# Patient Record
Sex: Female | Born: 1990 | Race: White | Hispanic: No | Marital: Single | Smoking: Former smoker
Health system: Southern US, Community
[De-identification: ages and names within clinical notes are randomized; demographics above are authoritative.]

## PROBLEM LIST (undated history)

## (undated) ENCOUNTER — Inpatient Hospital Stay: Payer: Self-pay

## (undated) DIAGNOSIS — F419 Anxiety disorder, unspecified: Secondary | ICD-10-CM

## (undated) DIAGNOSIS — F32A Depression, unspecified: Secondary | ICD-10-CM

## (undated) DIAGNOSIS — F329 Major depressive disorder, single episode, unspecified: Secondary | ICD-10-CM

## (undated) DIAGNOSIS — D649 Anemia, unspecified: Secondary | ICD-10-CM

## (undated) HISTORY — DX: Anxiety disorder, unspecified: F41.9

## (undated) HISTORY — DX: Major depressive disorder, single episode, unspecified: F32.9

## (undated) HISTORY — DX: Depression, unspecified: F32.A

---
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
## 2013-03-05 ENCOUNTER — Emergency Department: Payer: Self-pay | Admitting: Emergency Medicine

## 2013-03-05 LAB — COMPREHENSIVE METABOLIC PANEL
Anion Gap: 5 — ABNORMAL LOW (ref 7–16)
Bilirubin,Total: 0.4 mg/dL (ref 0.2–1.0)
Calcium, Total: 9.8 mg/dL (ref 8.5–10.1)
Co2: 27 mmol/L (ref 21–32)
Creatinine: 0.87 mg/dL (ref 0.60–1.30)
EGFR (African American): >60
EGFR (Non-African Amer.): >60
SGPT (ALT): 20 U/L (ref 12–78)
Sodium: 136 mmol/L (ref 136–145)

## 2013-03-05 LAB — CBC
HGB: 14.1 g/dL (ref 12.0–16.0)
MCHC: 34.6 g/dL (ref 32.0–36.0)
MCV: 87 fL (ref 80–100)
Platelet: 212 10*3/uL (ref 150–440)
RBC: 4.66 10*6/uL (ref 3.80–5.20)
RDW: 13 % (ref 11.5–14.5)
WBC: 7.9 10*3/uL (ref 3.6–11.0)

## 2013-03-05 LAB — URINALYSIS, COMPLETE
Bilirubin,UR: NEGATIVE
Blood: NEGATIVE
Nitrite: NEGATIVE
Ph: 6 (ref 4.5–8.0)
Specific Gravity: 1.017 (ref 1.003–1.030)
WBC UR: 4 /HPF (ref 0–5)

## 2013-03-05 LAB — LIPASE, BLOOD: Lipase: 113 U/L (ref 73–393)

## 2013-04-20 LAB — OB RESULTS CONSOLE ANTIBODY SCREEN: ANTIBODY SCREEN: NEGATIVE

## 2013-04-20 LAB — OB RESULTS CONSOLE ABO/RH: RH TYPE: POSITIVE

## 2013-04-20 LAB — OB RESULTS CONSOLE RPR: RPR: NONREACTIVE

## 2013-04-20 LAB — OB RESULTS CONSOLE HEPATITIS B SURFACE ANTIGEN: Hepatitis B Surface Ag: NEGATIVE

## 2013-04-20 LAB — OB RESULTS CONSOLE HIV ANTIBODY (ROUTINE TESTING): HIV: NONREACTIVE

## 2013-04-20 LAB — OB RESULTS CONSOLE RUBELLA ANTIBODY, IGM: Rubella: IMMUNE

## 2013-05-13 HISTORY — PX: OTHER SURGICAL HISTORY: SHX169

## 2013-08-25 LAB — OB RESULTS CONSOLE RPR: RPR: NONREACTIVE

## 2013-10-28 LAB — OB RESULTS CONSOLE GBS
GBS: NEGATIVE
GBS: NEGATIVE

## 2013-11-12 ENCOUNTER — Encounter (HOSPITAL_COMMUNITY): Payer: Self-pay | Admitting: *Deleted

## 2013-11-12 ENCOUNTER — Inpatient Hospital Stay (HOSPITAL_COMMUNITY)
Admission: AD | Admit: 2013-11-12 | Discharge: 2013-11-12 | Disposition: A | Discharge status: Home / Self Care | Payer: Medicaid Other | Source: Ambulatory Visit | Attending: Obstetrics and Gynecology | Admitting: Obstetrics and Gynecology

## 2013-11-12 DIAGNOSIS — O2390 Unspecified genitourinary tract infection in pregnancy, unspecified trimester: Secondary | ICD-10-CM | POA: Insufficient documentation

## 2013-11-12 DIAGNOSIS — N949 Unspecified condition associated with female genital organs and menstrual cycle: Secondary | ICD-10-CM | POA: Insufficient documentation

## 2013-11-12 DIAGNOSIS — R1032 Left lower quadrant pain: Secondary | ICD-10-CM | POA: Insufficient documentation

## 2013-11-12 DIAGNOSIS — N3000 Acute cystitis without hematuria: Secondary | ICD-10-CM | POA: Insufficient documentation

## 2013-11-12 HISTORY — DX: Anemia, unspecified: D64.9

## 2013-11-12 LAB — URINALYSIS, ROUTINE W REFLEX MICROSCOPIC
BILIRUBIN URINE: NEGATIVE
Glucose, UA: NEGATIVE mg/dL
KETONES UR: NEGATIVE mg/dL
NITRITE: NEGATIVE
Protein, ur: NEGATIVE mg/dL
SPECIFIC GRAVITY, URINE: 1.01 (ref 1.005–1.030)
Urobilinogen, UA: 0.2 mg/dL (ref 0.0–1.0)
pH: 6.5 (ref 5.0–8.0)

## 2013-11-12 LAB — URINE MICROSCOPIC-ADD ON

## 2013-11-12 MED ORDER — NITROFURANTOIN MONOHYD MACRO 100 MG PO CAPS: 100.0000 mg | ORAL_CAPSULE | Freq: Two times a day (BID) | ORAL | Status: DC

## 2013-11-12 NOTE — Discharge Instructions (Signed)
Pregnancy and Urinary Tract Infection °A urinary tract infection (UTI) is a bacterial infection of the urinary tract. Infection of the urinary tract can include the ureters, kidneys (pyelonephritis), bladder (cystitis), and urethra (urethritis). All pregnant women should be screened for bacteria in the urinary tract. Identifying and treating a UTI will decrease the risk of preterm labor and developing more serious infections in both the mother and baby. °CAUSES °Bacteria germs cause almost all UTIs.  °RISK FACTORS °Many factors can increase your chances of getting a UTI during pregnancy. These include: °· Having a short urethra. °· Poor toilet and hygiene habits. °· Sexual intercourse. °· Blockage of urine along the urinary tract. °· Problems with the pelvic muscles or nerves. °· Diabetes. °· Obesity. °· Bladder problems after having several children. °· Previous history of UTI. °SIGNS AND SYMPTOMS  °· Pain, burning, or a stinging feeling when urinating. °· Suddenly feeling the need to urinate right away (urgency). °· Loss of bladder control (urinary incontinence). °· Frequent urination, more than is common with pregnancy. °· Lower abdominal or back discomfort. °· Cloudy urine. °· Blood in the urine (hematuria). °· Fever.  °When the kidneys are infected, the symptoms may be: °· Back pain. °· Flank pain on the right side more so than the left. °· Fever. °· Chills. °· Nausea. °· Vomiting. °DIAGNOSIS  °A urinary tract infection is usually diagnosed through urine tests. Additional tests and procedures are sometimes done. These may include: °· Ultrasound exam of the kidneys, ureters, bladder, and urethra. °· Looking in the bladder with a lighted tube (cystoscopy). °TREATMENT °Typically, UTIs can be treated with antibiotic medicines.  °HOME CARE INSTRUCTIONS  °· Only take over-the-counter or prescription medicines as directed by your health care provider. If you were prescribed antibiotics, take them as directed. Finish  them even if you start to feel better. °· Drink enough fluids to keep your urine clear or pale yellow. °· Do not have sexual intercourse until the infection is gone and your health care provider says it is okay. °· Make sure you are tested for UTIs throughout your pregnancy. These infections often come back.  °Preventing a UTI in the Future °· Practice good toilet habits. Always wipe from front to back. Use the tissue only once. °· Do not hold your urine. Empty your bladder as soon as possible when the urge comes. °· Do not douche or use deodorant sprays. °· Wash with soap and warm water around the genital area and the anus. °· Empty your bladder before and after sexual intercourse. °· Wear underwear with a cotton crotch. °· Avoid caffeine and carbonated drinks. They can irritate the bladder. °· Drink cranberry juice or take cranberry pills. This may decrease the risk of getting a UTI. °· Do not drink alcohol. °· Keep all your appointments and tests as scheduled.  °SEEK MEDICAL CARE IF:  °· Your symptoms get worse. °· You are still having fevers 2 or more days after treatment begins. °· You have a rash. °· You feel that you are having problems with medicines prescribed. °· You have abnormal vaginal discharge. °SEEK IMMEDIATE MEDICAL CARE IF:  °· You have back or flank pain. °· You have chills. °· You have blood in your urine. °· You have nausea and vomiting. °· You have contractions of your uterus. °· You have a gush of fluid from the vagina. °MAKE SURE YOU: °· Understand these instructions.   °· Will watch your condition.   °· Will get help right away if you are not doing   well or get worse.   °Document Released: 08/24/2010 Document Revised: 02/17/2013 Document Reviewed: 11/26/2012 °[employer]® Patient Information ©2015 [employer]. This information is not intended to replace advice given to you by your health care provider. Make sure you discuss any questions you have with your health care provider. ° °

## 2013-11-12 NOTE — MAU Note (Signed)
Pains in lower abd and vagina all day which come and go. Some pain in L lateral side on way to hosp. Denies bleeding or leaking fld

## 2013-11-12 NOTE — MAU Provider Note (Signed)
Chief Complaint:  Vaginal Pain and Abdominal Pain   First Provider Initiated Contact with Patient 11/12/13 2033      HPI: Yolanda Pierce is a 23 y.o. G1P0 at 6329w4d who presents to maternity admissions reporting 1 d hx intermittent leftL>right lower abd and groin pain, worse with moving. Rates LLQ pain 3/10 but denies any pain now at rest. No dysuria, frequency or urgency of urination. No fever/chills, nausea/ vomiting. Denies painful contractions, leakage of fluid or vaginal bleeding. No H/A or upper abd pain. Good fetal movement.   Pregnancy Course: essentially uncomplicated  Past Medical History: Past Medical History  Diagnosis Date  . Anemia     Past obstetric history: OB History  Gravida Para Term Preterm AB SAB TAB Ectopic Multiple Living  1             # Outcome Date GA Lbr Len/2nd Weight Sex Delivery Anes PTL Lv  1 CUR               Past Surgical History: Past Surgical History  Procedure Laterality Date  . No past surgeries       Family History: Family History  Problem Relation Age of Onset  . Asthma Mother   . Hypertension Mother   . Diabetes Father   . Hypertension Father     Social History: History  Substance Use Topics  . Smoking status: Never Smoker   . Smokeless tobacco: Never Used  . Alcohol Use: Yes     Comment: only social use before pregnancy    Allergies:  Allergies  Allergen Reactions  . Amoxicillin Rash    Redness and rash all over    Meds:  Prescriptions prior to admission  Medication Sig Dispense Refill  . Iron TABS Take 1 tablet by mouth daily.      . Prenatal Vit-Fe Fumarate-FA (PRENATAL MULTIVITAMIN) TABS tablet Take 1 tablet by mouth daily at 12 noon.        ROS: Pertinent findings in history of present illness.  Physical Exam  Blood pressure 141/75, pulse 89, temperature 98.3 F (36.8 C), resp. rate 20, height 5\' 1"  (1.549 m), weight 196 lb 3.2 oz (88.996 kg). BP recheck 122/82  GENERAL: Well-developed, well-nourished  female in no acute distress.  HEENT: normocephalic HEART: normal rate RESP: normal effort ABDOMEN: Soft, non-tender, gravid appropriate for gestational age EXTREMITIES: Nontender, no edema NEURO: alert and oriented Dilation: 1 Effacement (%): Thick Cervical Position: Posterior Station:  (high) Presentation: Vertex Exam by:: Deidre Lavon Horn CNM  FHT:  Baseline 140 , moderate variability, accelerations present, no decelerations Contractions: occ, mild   Labs: Results for orders placed during the hospital encounter of 11/12/13 (from the past 24 hour(s))  URINALYSIS, ROUTINE W REFLEX MICROSCOPIC     Status: Abnormal   Collection Time    11/12/13  7:52 PM      Result Value Ref Range   Color, Urine YELLOW  YELLOW   APPearance CLEAR  CLEAR   Specific Gravity, Urine 1.010  1.005 - 1.030   pH 6.5  5.0 - 8.0   Glucose, UA NEGATIVE  NEGATIVE mg/dL   Hgb urine dipstick SMALL (*) NEGATIVE   Bilirubin Urine NEGATIVE  NEGATIVE   Ketones, ur NEGATIVE  NEGATIVE mg/dL   Protein, ur NEGATIVE  NEGATIVE mg/dL   Urobilinogen, UA 0.2  0.0 - 1.0 mg/dL   Nitrite NEGATIVE  NEGATIVE   Leukocytes, UA LARGE (*) NEGATIVE  URINE MICROSCOPIC-ADD ON     Status: Abnormal  Collection Time    11/12/13  7:52 PM      Result Value Ref Range   Squamous Epithelial / LPF FEW (*) RARE   WBC, UA 7-10  <3 WBC/hpf   RBC / HPF 3-6  <3 RBC/hpf   Bacteria, UA MANY (*) RARE    Imaging:  No results found. MAU Course: C/W Dr. Tenny Crawoss Urine C&S sent  Assessment:  G1 @[redacted]w[redacted]d  RLP Category 1 FHR, no evidence labor 1. Acute cystitis without hematuria     Plan: Discharge home Labor precautions and fetal kick counts    Medication List    ASK your doctor about these medications       Iron Tabs  Take 1 tablet by mouth daily.     prenatal multivitamin Tabs tablet  Take 1 tablet by mouth daily at 12 noon.        Danae Orleanseirdre C Shyann Hefner, CNM 11/12/2013 8:37 PM

## 2013-11-13 LAB — OB RESULTS CONSOLE GBS: STREP GROUP B AG: NEGATIVE

## 2013-11-14 LAB — CULTURE, OB URINE

## 2013-11-21 ENCOUNTER — Inpatient Hospital Stay (HOSPITAL_COMMUNITY)
Admit: 2013-11-21 | Discharge: 2013-11-26 | DRG: 766 | Disposition: A | Discharge status: Home / Self Care | Payer: Medicaid Other | Source: Ambulatory Visit | Attending: Obstetrics & Gynecology | Admitting: Obstetrics & Gynecology

## 2013-11-21 DIAGNOSIS — O9902 Anemia complicating childbirth: Secondary | ICD-10-CM | POA: Diagnosis present

## 2013-11-21 DIAGNOSIS — O480 Post-term pregnancy: Secondary | ICD-10-CM | POA: Diagnosis present

## 2013-11-21 DIAGNOSIS — D649 Anemia, unspecified: Secondary | ICD-10-CM | POA: Diagnosis present

## 2013-11-21 DIAGNOSIS — Z98891 History of uterine scar from previous surgery: Secondary | ICD-10-CM

## 2013-11-21 DIAGNOSIS — O339 Maternal care for disproportion, unspecified: Secondary | ICD-10-CM | POA: Diagnosis present

## 2013-11-21 DIAGNOSIS — O330 Maternal care for disproportion due to deformity of maternal pelvic bones: Secondary | ICD-10-CM | POA: Diagnosis present

## 2013-11-21 LAB — CBC
HCT: 31.9 % — ABNORMAL LOW (ref 36.0–46.0)
HEMOGLOBIN: 10.1 g/dL — AB (ref 12.0–15.0)
MCH: 26 pg (ref 26.0–34.0)
MCHC: 31.7 g/dL (ref 30.0–36.0)
MCV: 82 fL (ref 78.0–100.0)
Platelets: 169 10*3/uL (ref 150–400)
RBC: 3.89 MIL/uL (ref 3.87–5.11)
RDW: 14.5 % (ref 11.5–15.5)
WBC: 9.9 10*3/uL (ref 4.0–10.5)

## 2013-11-21 LAB — OB RESULTS CONSOLE RPR: RPR: NONREACTIVE

## 2013-11-21 MED ORDER — LACTATED RINGERS IV SOLN: 500.0000 mL | INTRAVENOUS | Status: DC | PRN

## 2013-11-21 MED ORDER — MISOPROSTOL 25 MCG QUARTER TABLET
25.0000 ug | ORAL_TABLET | ORAL | Status: DC | PRN
  Administered 2013-11-21 – 2013-11-22 (×2): 25 ug via VAGINAL
  Filled 2013-11-21 (×2): qty 0.25

## 2013-11-21 MED ORDER — OXYTOCIN BOLUS FROM INFUSION: 500.0000 mL | INTRAVENOUS | Status: DC

## 2013-11-21 MED ORDER — TERBUTALINE SULFATE 1 MG/ML IJ SOLN: 0.2500 mg | Freq: Once | INTRAMUSCULAR | Status: AC | PRN

## 2013-11-21 MED ORDER — IBUPROFEN 600 MG PO TABS: 600.0000 mg | ORAL_TABLET | Freq: Four times a day (QID) | ORAL | Status: DC | PRN

## 2013-11-21 MED ORDER — LACTATED RINGERS IV SOLN
INTRAVENOUS | Status: DC
  Administered 2013-11-21 – 2013-11-24 (×7): via INTRAVENOUS

## 2013-11-21 MED ORDER — CITRIC ACID-SODIUM CITRATE 334-500 MG/5ML PO SOLN
30.0000 mL | ORAL | Status: DC | PRN
  Administered 2013-11-24: 30 mL via ORAL
  Filled 2013-11-21: qty 15

## 2013-11-21 MED ORDER — ONDANSETRON HCL 4 MG/2ML IJ SOLN
4.0000 mg | Freq: Four times a day (QID) | INTRAMUSCULAR | Status: DC | PRN
  Administered 2013-11-22 – 2013-11-24 (×3): 4 mg via INTRAVENOUS
  Filled 2013-11-21 (×3): qty 2

## 2013-11-21 MED ORDER — LIDOCAINE HCL (PF) 1 % IJ SOLN
30.0000 mL | INTRAMUSCULAR | Status: DC | PRN
  Filled 2013-11-21: qty 30

## 2013-11-21 MED ORDER — ACETAMINOPHEN 325 MG PO TABS: 650.0000 mg | ORAL_TABLET | ORAL | Status: DC | PRN

## 2013-11-21 MED ORDER — OXYTOCIN 40 UNITS IN LACTATED RINGERS INFUSION - SIMPLE MED
62.5000 mL/h | INTRAVENOUS | Status: DC
  Filled 2013-11-21: qty 1000

## 2013-11-21 MED ORDER — OXYCODONE-ACETAMINOPHEN 5-325 MG PO TABS: 1.0000 | ORAL_TABLET | ORAL | Status: DC | PRN

## 2013-11-22 LAB — RPR

## 2013-11-22 LAB — TYPE AND SCREEN
ABO/RH(D): A POS
ANTIBODY SCREEN: NEGATIVE

## 2013-11-22 LAB — ABO/RH: ABO/RH(D): A POS

## 2013-11-22 MED ORDER — FENTANYL CITRATE 0.05 MG/ML IJ SOLN
100.0000 ug | INTRAMUSCULAR | Status: DC | PRN
  Administered 2013-11-22 – 2013-11-24 (×7): 100 ug via INTRAVENOUS
  Filled 2013-11-22 (×7): qty 2

## 2013-11-22 MED ORDER — LACTATED RINGERS IV SOLN
500.0000 mL | Freq: Once | INTRAVENOUS | Status: DC
Start: 2013-11-22 — End: 2013-11-24

## 2013-11-22 MED ORDER — EPHEDRINE 5 MG/ML INJ: 10.0000 mg | INTRAVENOUS | Status: DC | PRN

## 2013-11-22 MED ORDER — PHENYLEPHRINE 40 MCG/ML (10ML) SYRINGE FOR IV PUSH (FOR BLOOD PRESSURE SUPPORT): 80.0000 ug | PREFILLED_SYRINGE | INTRAVENOUS | Status: DC | PRN

## 2013-11-22 MED ORDER — PHENYLEPHRINE 40 MCG/ML (10ML) SYRINGE FOR IV PUSH (FOR BLOOD PRESSURE SUPPORT)
80.0000 ug | PREFILLED_SYRINGE | INTRAVENOUS | Status: DC | PRN
  Filled 2013-11-22: qty 10

## 2013-11-22 MED ORDER — FENTANYL 2.5 MCG/ML BUPIVACAINE 1/10 % EPIDURAL INFUSION (WH - ANES)
14.0000 mL/h | INTRAMUSCULAR | Status: DC | PRN
  Administered 2013-11-23 – 2013-11-24 (×7): 14 mL/h via EPIDURAL
  Filled 2013-11-22 (×7): qty 125

## 2013-11-22 MED ORDER — DIPHENHYDRAMINE HCL 50 MG/ML IJ SOLN: 12.5000 mg | INTRAMUSCULAR | Status: DC | PRN

## 2013-11-22 MED ORDER — OXYTOCIN 40 UNITS IN LACTATED RINGERS INFUSION - SIMPLE MED
1.0000 m[IU]/min | INTRAVENOUS | Status: DC
  Administered 2013-11-22: 2 m[IU]/min via INTRAVENOUS
  Administered 2013-11-23: 20 m[IU]/min via INTRAVENOUS
  Filled 2013-11-22: qty 1000

## 2013-11-22 NOTE — H&P (Signed)
23 y.o. 654w0d  G1P0 comes in for IOL for pdd.  Otherwise has good fetal movement and no bleeding.  Past Medical History  Diagnosis Date  . Anemia     Past Surgical History  Procedure Laterality Date  . No past surgeries      OB History  Gravida Para Term Preterm AB SAB TAB Ectopic Multiple Living  1             # Outcome Date GA Lbr Len/2nd Weight Sex Delivery Anes PTL Lv  1 CUR               History   Social History  . Marital Status: Single    Spouse Name: N/A    Number of Children: N/A  . Years of Education: N/A   Occupational History  . Not on file.   Social History Main Topics  . Smoking status: Never Smoker   . Smokeless tobacco: Never Used  . Alcohol Use: Yes     Comment: only social use before pregnancy  . Drug Use: No  . Sexual Activity: Not on file   Other Topics Concern  . Not on file   Social History Narrative  . No narrative on file   Amoxicillin    Prenatal Transfer Tool  Maternal Diabetes: No Genetic Screening: Normal Maternal Ultrasounds/Referrals: Normal Fetal Ultrasounds or other Referrals:  None Maternal Substance Abuse:  No Significant Maternal Medications:  None Significant Maternal Lab Results: Lab values include: Group B Strep negative  Other PNC: uncomplicated.    Filed Vitals:   11/22/13 0901  BP: 123/70  Pulse: 70  Temp:   Resp: 20     Lungs/Cor:  NAD Abdomen:  soft, gravid Ex:  no cords, erythema SVE:  1/50/-2 FHTs:  135, good STV, NST R Toco:  q 1.5-3   A/P  Admit for IOL  GBS Neg  Received one dose of cytotec last night, ctx too much to receive another, switched to pitocin.  May consider d/c of pit and back to cytotec if no change.  Other routine care  Epidural when desired.  Philip AspenALLAHAN, Larsen Dungan

## 2013-11-22 NOTE — Progress Notes (Addendum)
Patient called out c/o constant back pain, gave patient IV pain medication and also called Dr Claiborne Billingsallahan to notify of inability to place another cytotec; orders were obtained to give patient another hour to see if contractions space out enough to place another cytotec and also to give patient a K pad for her back; Kpad  brought to patient's room and allowed to warm up and applied on patient's back; patient c/o only back pain at this time; instructed patient to give kpad a few minutes to see if it will help and if not we can try something else for pain; patient verbalized understanding.

## 2013-11-23 ENCOUNTER — Encounter (HOSPITAL_COMMUNITY): Payer: Self-pay | Admitting: *Deleted

## 2013-11-23 ENCOUNTER — Encounter (HOSPITAL_COMMUNITY): Payer: Medicaid Other | Admitting: Anesthesiology

## 2013-11-23 ENCOUNTER — Inpatient Hospital Stay (HOSPITAL_COMMUNITY): Payer: Medicaid Other | Admitting: Anesthesiology

## 2013-11-23 MED ORDER — LIDOCAINE HCL (PF) 1 % IJ SOLN
INTRAMUSCULAR | Status: DC | PRN
Start: 2013-11-23 — End: 2013-11-24
  Administered 2013-11-23 (×4): 4 mL

## 2013-11-23 NOTE — Anesthesia Preprocedure Evaluation (Addendum)
Anesthesia Evaluation  Patient identified by MRN, date of birth, ID band Patient awake    Reviewed: Allergy & Precautions, H&P , NPO status , Patient's Chart, lab work & pertinent test results, reviewed documented beta blocker date and time   History of Anesthesia Complications Negative for: history of anesthetic complications  Airway Mallampati: III TM Distance: >3 FB Neck ROM: full    Dental  (+) Teeth Intact   Pulmonary neg pulmonary ROS,  breath sounds clear to auscultation        Cardiovascular negative cardio ROS  Rhythm:regular Rate:Normal     Neuro/Psych negative neurological ROS  negative psych ROS   GI/Hepatic negative GI ROS, Neg liver ROS,   Endo/Other  BMI 36.5  Renal/GU negative Renal ROS     Musculoskeletal   Abdominal   Peds  Hematology  (+) anemia ,   Anesthesia Other Findings   Reproductive/Obstetrics (+) Pregnancy                           Anesthesia Physical Anesthesia Plan  ASA: II and emergent  Anesthesia Plan: Epidural   Post-op Pain Management:    Induction:   Airway Management Planned: Natural Airway  Additional Equipment:   Intra-op Plan:   Post-operative Plan:   Informed Consent: I have reviewed the patients History and Physical, chart, labs and discussed the procedure including the risks, benefits and alternatives for the proposed anesthesia with the patient or authorized representative who has indicated his/her understanding and acceptance.     Plan Discussed with: CRNA, Anesthesiologist and Surgeon  Anesthesia Plan Comments: (Patient for C/Section for arrest of descent. Will use existing epidural catheter for C/Section.)       Anesthesia Quick Evaluation

## 2013-11-23 NOTE — Progress Notes (Signed)
SCDs applied per MD order after epidural; yellow socks refused at this time

## 2013-11-23 NOTE — Progress Notes (Signed)
Pt doing well.    120s, g stv, NST R, one mild variable Toco q2-3  SVE 3/90/-2; AROM clear  Continue induction- pit.

## 2013-11-23 NOTE — Progress Notes (Signed)
Pt received cytotec x 1 dose Sunday night, was contracting too frequently for second dose so switched to pitocin overnight.  Monday morning no cervical change from 1cm, pitocin d/c's and cytotec restarted.  She received cytotec during the day and again was contracting too frequently for 7pm dose.  Attempt was made to place a foley bulb, pt had received IV pain medication which was not working well for her.  Two attempts to insert the foley bulb were made without success and had to stop due to pt discomfort.  Will again try pitocin 2x2 overnight. FHT Cat 1.

## 2013-11-23 NOTE — Anesthesia Procedure Notes (Signed)
Epidural Patient location during procedure: OB Start time: 11/23/2013 12:16 AM  Staffing Performed by: anesthesiologist   Preanesthetic Checklist Completed: patient identified, site marked, surgical consent, pre-op evaluation, timeout performed, IV checked, risks and benefits discussed and monitors and equipment checked  Epidural Patient position: sitting Prep: site prepped and draped and DuraPrep Patient monitoring: continuous pulse ox and blood pressure Approach: midline Location: L3-L4 Injection technique: LOR air  Needle:  Needle type: Tuohy  Needle gauge: 17 G Needle length: 9 cm and 9 Needle insertion depth: 6 cm Catheter type: closed end flexible Catheter size: 19 Gauge Catheter at skin depth: 11 cm Test dose: negative  Assessment Events: blood not aspirated, injection not painful, no injection resistance, negative IV test and no paresthesia  Additional Notes Discussed risk of headache, infection, bleeding, nerve injury and failed or incomplete block.  Patient voices understanding and wishes to proceed.  Epidural placed easily on first attempt. No paresthesia.  Patient tolerated procedure well with no apparent complications.  Jasmine DecemberA> Krystel Fletchall, MDReason for block:procedure for pain

## 2013-11-24 ENCOUNTER — Encounter (HOSPITAL_COMMUNITY): Payer: Self-pay | Admitting: *Deleted

## 2013-11-24 ENCOUNTER — Encounter (HOSPITAL_COMMUNITY)
Admission: AD | Disposition: A | Discharge status: Home / Self Care | Payer: Self-pay | Source: Ambulatory Visit | Attending: Obstetrics and Gynecology

## 2013-11-24 ENCOUNTER — Inpatient Hospital Stay (HOSPITAL_COMMUNITY): Admission: AD | Admit: 2013-11-24 | Payer: Self-pay | Source: Ambulatory Visit | Admitting: Obstetrics and Gynecology

## 2013-11-24 LAB — COMPREHENSIVE METABOLIC PANEL
ALBUMIN: 2.4 g/dL — AB (ref 3.5–5.2)
ALT: 9 U/L (ref 0–35)
AST: 21 U/L (ref 0–37)
Alkaline Phosphatase: 163 U/L — ABNORMAL HIGH (ref 39–117)
Anion gap: 18 — ABNORMAL HIGH (ref 5–15)
BILIRUBIN TOTAL: 0.8 mg/dL (ref 0.3–1.2)
BUN: 11 mg/dL (ref 6–23)
CALCIUM: 8.5 mg/dL (ref 8.4–10.5)
CHLORIDE: 99 meq/L (ref 96–112)
CO2: 19 mEq/L (ref 19–32)
CREATININE: 1.21 mg/dL — AB (ref 0.50–1.10)
GFR calc Af Amer: 74 mL/min — ABNORMAL LOW (ref >90)
GFR calc non Af Amer: 63 mL/min — ABNORMAL LOW (ref >90)
Glucose, Bld: 99 mg/dL (ref 70–99)
Potassium: 3.4 mEq/L — ABNORMAL LOW (ref 3.7–5.3)
Sodium: 136 mEq/L — ABNORMAL LOW (ref 137–147)
Total Protein: 5.4 g/dL — ABNORMAL LOW (ref 6.0–8.3)

## 2013-11-24 LAB — CBC
HEMATOCRIT: 30.4 % — AB (ref 36.0–46.0)
Hemoglobin: 9.9 g/dL — ABNORMAL LOW (ref 12.0–15.0)
MCH: 26.5 pg (ref 26.0–34.0)
MCHC: 32.6 g/dL (ref 30.0–36.0)
MCV: 81.5 fL (ref 78.0–100.0)
Platelets: 156 10*3/uL (ref 150–400)
RBC: 3.73 MIL/uL — ABNORMAL LOW (ref 3.87–5.11)
RDW: 14.7 % (ref 11.5–15.5)
WBC: 21 10*3/uL — ABNORMAL HIGH (ref 4.0–10.5)

## 2013-11-24 LAB — SAMPLE TO BLOOD BANK

## 2013-11-24 LAB — LACTATE DEHYDROGENASE: LDH: 273 U/L — ABNORMAL HIGH (ref 94–250)

## 2013-11-24 LAB — URIC ACID: Uric Acid, Serum: 8.5 mg/dL — ABNORMAL HIGH (ref 2.4–7.0)

## 2013-11-24 SURGERY — Surgical Case
Anesthesia: Epidural | Site: Abdomen

## 2013-11-24 MED ORDER — MORPHINE SULFATE (PF) 0.5 MG/ML IJ SOLN
INTRAMUSCULAR | Status: DC | PRN
  Administered 2013-11-24: 1 mg via INTRAVENOUS
  Administered 2013-11-24: 4 mg via EPIDURAL

## 2013-11-24 MED ORDER — OXYTOCIN 10 UNIT/ML IJ SOLN
INTRAMUSCULAR | Status: AC
  Filled 2013-11-24: qty 4

## 2013-11-24 MED ORDER — LACTATED RINGERS IV SOLN
INTRAVENOUS | Status: DC
  Administered 2013-11-24 – 2013-11-25 (×2): via INTRAVENOUS

## 2013-11-24 MED ORDER — METOCLOPRAMIDE HCL 5 MG/ML IJ SOLN
INTRAMUSCULAR | Status: DC | PRN
  Administered 2013-11-24: 10 mg via INTRAVENOUS

## 2013-11-24 MED ORDER — PRENATAL MULTIVITAMIN CH
1.0000 | ORAL_TABLET | Freq: Every day | ORAL | Status: DC
  Administered 2013-11-25 – 2013-11-26 (×2): 1 via ORAL
  Filled 2013-11-24 (×4): qty 1

## 2013-11-24 MED ORDER — ONDANSETRON HCL 4 MG/2ML IJ SOLN: 4.0000 mg | INTRAMUSCULAR | Status: DC | PRN

## 2013-11-24 MED ORDER — METOCLOPRAMIDE HCL 5 MG/ML IJ SOLN: 10.0000 mg | Freq: Three times a day (TID) | INTRAMUSCULAR | Status: DC | PRN

## 2013-11-24 MED ORDER — PHENYLEPHRINE 40 MCG/ML (10ML) SYRINGE FOR IV PUSH (FOR BLOOD PRESSURE SUPPORT)
PREFILLED_SYRINGE | INTRAVENOUS | Status: AC
  Filled 2013-11-24: qty 5

## 2013-11-24 MED ORDER — KETOROLAC TROMETHAMINE 30 MG/ML IJ SOLN
30.0000 mg | Freq: Four times a day (QID) | INTRAMUSCULAR | Status: AC | PRN
  Administered 2013-11-24: 30 mg via INTRAVENOUS

## 2013-11-24 MED ORDER — DIPHENHYDRAMINE HCL 50 MG/ML IJ SOLN: 12.5000 mg | INTRAMUSCULAR | Status: DC | PRN

## 2013-11-24 MED ORDER — SCOPOLAMINE 1 MG/3DAYS TD PT72
MEDICATED_PATCH | TRANSDERMAL | Status: AC
  Filled 2013-11-24: qty 1

## 2013-11-24 MED ORDER — LACTATED RINGERS IV SOLN
INTRAVENOUS | Status: DC | PRN
  Administered 2013-11-24 (×3): via INTRAVENOUS

## 2013-11-24 MED ORDER — KETOROLAC TROMETHAMINE 30 MG/ML IJ SOLN
INTRAMUSCULAR | Status: AC
  Filled 2013-11-24: qty 1

## 2013-11-24 MED ORDER — DIBUCAINE 1 % RE OINT
1.0000 "application " | TOPICAL_OINTMENT | RECTAL | Status: DC | PRN
  Filled 2013-11-24: qty 28

## 2013-11-24 MED ORDER — SENNOSIDES-DOCUSATE SODIUM 8.6-50 MG PO TABS
2.0000 | ORAL_TABLET | ORAL | Status: DC
  Administered 2013-11-24 – 2013-11-25 (×2): 2 via ORAL
  Filled 2013-11-24 (×4): qty 2

## 2013-11-24 MED ORDER — SIMETHICONE 80 MG PO CHEW
80.0000 mg | CHEWABLE_TABLET | ORAL | Status: DC | PRN
  Filled 2013-11-24: qty 1

## 2013-11-24 MED ORDER — SODIUM BICARBONATE 8.4 % IV SOLN
INTRAVENOUS | Status: AC
  Filled 2013-11-24: qty 50

## 2013-11-24 MED ORDER — FENTANYL CITRATE 0.05 MG/ML IJ SOLN
INTRAMUSCULAR | Status: DC | PRN
  Administered 2013-11-24: 100 ug via INTRAVENOUS

## 2013-11-24 MED ORDER — NALOXONE HCL 0.4 MG/ML IJ SOLN: 0.4000 mg | INTRAMUSCULAR | Status: DC | PRN

## 2013-11-24 MED ORDER — DIPHENHYDRAMINE HCL 25 MG PO CAPS
25.0000 mg | ORAL_CAPSULE | ORAL | Status: DC | PRN
  Filled 2013-11-24: qty 1

## 2013-11-24 MED ORDER — PHENYLEPHRINE HCL 10 MG/ML IJ SOLN
INTRAMUSCULAR | Status: DC | PRN
  Administered 2013-11-24 (×2): 40 ug via INTRAVENOUS

## 2013-11-24 MED ORDER — METHYLERGONOVINE MALEATE 0.2 MG/ML IJ SOLN
INTRAMUSCULAR | Status: DC | PRN
  Administered 2013-11-24: 0.2 mg via INTRAMUSCULAR

## 2013-11-24 MED ORDER — CHLOROPROCAINE HCL 3 % IJ SOLN
INTRAMUSCULAR | Status: AC
  Filled 2013-11-24: qty 20

## 2013-11-24 MED ORDER — KETOROLAC TROMETHAMINE 30 MG/ML IJ SOLN: 30.0000 mg | Freq: Four times a day (QID) | INTRAMUSCULAR | Status: AC | PRN

## 2013-11-24 MED ORDER — NALOXONE HCL 1 MG/ML IJ SOLN: 1.0000 ug/kg/h | INTRAVENOUS | Status: DC | PRN

## 2013-11-24 MED ORDER — IBUPROFEN 600 MG PO TABS
600.0000 mg | ORAL_TABLET | Freq: Four times a day (QID) | ORAL | Status: DC
  Administered 2013-11-24 – 2013-11-26 (×9): 600 mg via ORAL
  Filled 2013-11-24 (×10): qty 1

## 2013-11-24 MED ORDER — NALBUPHINE HCL 10 MG/ML IJ SOLN: 5.0000 mg | INTRAMUSCULAR | Status: DC | PRN

## 2013-11-24 MED ORDER — CLINDAMYCIN PHOSPHATE 900 MG/50ML IV SOLN: 900.0000 mg | INTRAVENOUS | Status: DC

## 2013-11-24 MED ORDER — FENTANYL CITRATE 0.05 MG/ML IJ SOLN: 25.0000 ug | INTRAMUSCULAR | Status: DC | PRN

## 2013-11-24 MED ORDER — DIPHENHYDRAMINE HCL 25 MG PO CAPS
25.0000 mg | ORAL_CAPSULE | Freq: Four times a day (QID) | ORAL | Status: DC | PRN
  Filled 2013-11-24: qty 1

## 2013-11-24 MED ORDER — SODIUM BICARBONATE 8.4 % IV SOLN
INTRAVENOUS | Status: DC | PRN
  Administered 2013-11-24: 7 mL via EPIDURAL
  Administered 2013-11-24: 6 mL via EPIDURAL
  Administered 2013-11-24: 3 mL via EPIDURAL
  Administered 2013-11-24: 4 mL via EPIDURAL

## 2013-11-24 MED ORDER — WITCH HAZEL-GLYCERIN EX PADS: 1.0000 "application " | MEDICATED_PAD | CUTANEOUS | Status: DC | PRN

## 2013-11-24 MED ORDER — LACTATED RINGERS IV SOLN
INTRAVENOUS | Status: DC | PRN
  Administered 2013-11-24: 10:00:00 via INTRAVENOUS

## 2013-11-24 MED ORDER — LIDOCAINE-EPINEPHRINE (PF) 2 %-1:200000 IJ SOLN
INTRAMUSCULAR | Status: AC
  Filled 2013-11-24: qty 20

## 2013-11-24 MED ORDER — OXYTOCIN 40 UNITS IN LACTATED RINGERS INFUSION - SIMPLE MED: 62.5000 mL/h | INTRAVENOUS | Status: AC

## 2013-11-24 MED ORDER — ONDANSETRON HCL 4 MG/2ML IJ SOLN
4.0000 mg | Freq: Three times a day (TID) | INTRAMUSCULAR | Status: DC | PRN
Start: 2013-11-24 — End: 2013-11-26

## 2013-11-24 MED ORDER — SODIUM CHLORIDE 0.9 % IJ SOLN: 3.0000 mL | INTRAMUSCULAR | Status: DC | PRN

## 2013-11-24 MED ORDER — SCOPOLAMINE 1 MG/3DAYS TD PT72
1.0000 | MEDICATED_PATCH | Freq: Once | TRANSDERMAL | Status: DC
  Administered 2013-11-24: 1.5 mg via TRANSDERMAL

## 2013-11-24 MED ORDER — ONDANSETRON HCL 4 MG/2ML IJ SOLN
INTRAMUSCULAR | Status: DC | PRN
  Administered 2013-11-24: 4 mg via INTRAVENOUS

## 2013-11-24 MED ORDER — ZOLPIDEM TARTRATE 5 MG PO TABS: 5.0000 mg | ORAL_TABLET | Freq: Every evening | ORAL | Status: DC | PRN

## 2013-11-24 MED ORDER — ONDANSETRON HCL 4 MG PO TABS: 4.0000 mg | ORAL_TABLET | ORAL | Status: DC | PRN

## 2013-11-24 MED ORDER — DIPHENHYDRAMINE HCL 50 MG/ML IJ SOLN: 25.0000 mg | INTRAMUSCULAR | Status: DC | PRN

## 2013-11-24 MED ORDER — LACTATED RINGERS IV BOLUS (SEPSIS)
1000.0000 mL | Freq: Once | INTRAVENOUS | Status: AC
  Administered 2013-11-24: 1000 mL via INTRAVENOUS

## 2013-11-24 MED ORDER — OXYCODONE-ACETAMINOPHEN 5-325 MG PO TABS
1.0000 | ORAL_TABLET | ORAL | Status: DC | PRN
  Administered 2013-11-25: 1 via ORAL
  Filled 2013-11-24: qty 1

## 2013-11-24 MED ORDER — PHENYLEPHRINE 8 MG IN D5W 100 ML (0.08MG/ML) PREMIX OPTIME
INJECTION | INTRAVENOUS | Status: AC
  Filled 2013-11-24: qty 100

## 2013-11-24 MED ORDER — SIMETHICONE 80 MG PO CHEW
80.0000 mg | CHEWABLE_TABLET | ORAL | Status: DC
  Administered 2013-11-24 – 2013-11-25 (×2): 80 mg via ORAL
  Filled 2013-11-24 (×4): qty 1

## 2013-11-24 MED ORDER — LIDOCAINE HCL (CARDIAC) 20 MG/ML IV SOLN
INTRAVENOUS | Status: AC
  Filled 2013-11-24: qty 5

## 2013-11-24 MED ORDER — MORPHINE SULFATE 0.5 MG/ML IJ SOLN
INTRAMUSCULAR | Status: AC
  Filled 2013-11-24: qty 10

## 2013-11-24 MED ORDER — OXYTOCIN 10 UNIT/ML IJ SOLN
40.0000 [IU] | INTRAVENOUS | Status: DC | PRN
  Administered 2013-11-24: 40 [IU] via INTRAVENOUS

## 2013-11-24 MED ORDER — LANOLIN HYDROUS EX OINT: 1.0000 "application " | TOPICAL_OINTMENT | CUTANEOUS | Status: DC | PRN

## 2013-11-24 MED ORDER — MEPERIDINE HCL 25 MG/ML IJ SOLN: 6.2500 mg | INTRAMUSCULAR | Status: DC | PRN

## 2013-11-24 MED ORDER — FENTANYL CITRATE 0.05 MG/ML IJ SOLN
INTRAMUSCULAR | Status: AC
  Filled 2013-11-24: qty 2

## 2013-11-24 MED ORDER — ONDANSETRON HCL 4 MG/2ML IJ SOLN
INTRAMUSCULAR | Status: AC
  Filled 2013-11-24: qty 2

## 2013-11-24 MED ORDER — TETANUS-DIPHTH-ACELL PERTUSSIS 5-2.5-18.5 LF-MCG/0.5 IM SUSP
0.5000 mL | Freq: Once | INTRAMUSCULAR | Status: DC
  Filled 2013-11-24: qty 0.5

## 2013-11-24 MED ORDER — SODIUM BICARBONATE 8.4 % IV SOLN
INTRAVENOUS | Status: AC
  Filled 2013-11-24: qty 100

## 2013-11-24 MED ORDER — SIMETHICONE 80 MG PO CHEW
80.0000 mg | CHEWABLE_TABLET | Freq: Three times a day (TID) | ORAL | Status: DC
  Administered 2013-11-24 – 2013-11-26 (×6): 80 mg via ORAL
  Filled 2013-11-24 (×11): qty 1

## 2013-11-24 MED ORDER — GENTAMICIN SULFATE 40 MG/ML IJ SOLN: 5.0000 mg/kg | INTRAVENOUS | Status: DC

## 2013-11-24 MED ORDER — MENTHOL 3 MG MT LOZG
1.0000 | LOZENGE | OROMUCOSAL | Status: DC | PRN
  Filled 2013-11-24: qty 9

## 2013-11-24 MED ORDER — PHENYLEPHRINE 8 MG IN D5W 100 ML (0.08MG/ML) PREMIX OPTIME
INJECTION | INTRAVENOUS | Status: DC | PRN
  Administered 2013-11-24: 60 ug/min via INTRAVENOUS

## 2013-11-24 MED ORDER — METHYLERGONOVINE MALEATE 0.2 MG/ML IJ SOLN
INTRAMUSCULAR | Status: AC
  Filled 2013-11-24: qty 1

## 2013-11-24 MED ORDER — GENTAMICIN SULFATE 40 MG/ML IJ SOLN
INTRAVENOUS | Status: AC
  Administered 2013-11-24: 114 mL via INTRAVENOUS
  Filled 2013-11-24: qty 8

## 2013-11-24 SURGICAL SUPPLY — 42 items
BARRIER ADHS 3X4 INTERCEED (GAUZE/BANDAGES/DRESSINGS) ×3 IMPLANT
BENZOIN TINCTURE PRP APPL 2/3 (GAUZE/BANDAGES/DRESSINGS) ×3 IMPLANT
CLAMP CORD UMBIL (MISCELLANEOUS) IMPLANT
CLOSURE WOUND 1/2 X4 (GAUZE/BANDAGES/DRESSINGS) ×1
CLOTH BEACON ORANGE TIMEOUT ST (SAFETY) ×3 IMPLANT
DRAPE LG THREE QUARTER DISP (DRAPES) IMPLANT
DRSG OPSITE POSTOP 4X10 (GAUZE/BANDAGES/DRESSINGS) ×3 IMPLANT
DURAPREP 26ML APPLICATOR (WOUND CARE) ×3 IMPLANT
ELECT REM PT RETURN 9FT ADLT (ELECTROSURGICAL) ×3
ELECTRODE REM PT RTRN 9FT ADLT (ELECTROSURGICAL) ×1 IMPLANT
EXTRACTOR VACUUM BELL STYLE (SUCTIONS) IMPLANT
GLOVE BIO SURGEON STRL SZ 6.5 (GLOVE) ×2 IMPLANT
GLOVE BIO SURGEONS STRL SZ 6.5 (GLOVE) ×1
GLOVE BIOGEL PI IND STRL 7.0 (GLOVE) ×1 IMPLANT
GLOVE BIOGEL PI INDICATOR 7.0 (GLOVE) ×2
GOWN STRL REUS W/TWL LRG LVL3 (GOWN DISPOSABLE) ×9 IMPLANT
KIT ABG SYR 3ML LUER SLIP (SYRINGE) IMPLANT
NEEDLE HYPO 25X5/8 SAFETYGLIDE (NEEDLE) IMPLANT
NS IRRIG 1000ML POUR BTL (IV SOLUTION) ×3 IMPLANT
PACK C SECTION WH (CUSTOM PROCEDURE TRAY) ×3 IMPLANT
PAD ABD 7.5X8 STRL (GAUZE/BANDAGES/DRESSINGS) ×3 IMPLANT
PAD ABD 8X7 1/2 STERILE (GAUZE/BANDAGES/DRESSINGS) ×3 IMPLANT
PAD OB MATERNITY 4.3X12.25 (PERSONAL CARE ITEMS) ×3 IMPLANT
RETRACTOR WND ALEXIS 25 LRG (MISCELLANEOUS) ×1 IMPLANT
RTRCTR WOUND ALEXIS 25CM LRG (MISCELLANEOUS) ×3
STAPLER VISISTAT 35W (STAPLE) IMPLANT
STRIP CLOSURE SKIN 1/2X4 (GAUZE/BANDAGES/DRESSINGS) ×2 IMPLANT
SUT CHROMIC 2 0 CT 1 (SUTURE) ×6 IMPLANT
SUT CHROMIC 3 0 SH 27 (SUTURE) ×3 IMPLANT
SUT MNCRL 0 VIOLET CTX 36 (SUTURE) ×3 IMPLANT
SUT MONOCRYL 0 CTX 36 (SUTURE) ×6
SUT PDS AB 0 CTX 36 PDP370T (SUTURE) IMPLANT
SUT PLAIN 2 0 (SUTURE) ×2
SUT PLAIN ABS 2-0 CT1 27XMFL (SUTURE) ×1 IMPLANT
SUT VIC AB 0 CT1 27 (SUTURE) ×4
SUT VIC AB 0 CT1 27XBRD ANBCTR (SUTURE) ×2 IMPLANT
SUT VIC AB 3-0 SH 27 (SUTURE) ×2
SUT VIC AB 3-0 SH 27X BRD (SUTURE) ×1 IMPLANT
SUT VIC AB 4-0 KS 27 (SUTURE) ×3 IMPLANT
TOWEL OR 17X24 6PK STRL BLUE (TOWEL DISPOSABLE) ×3 IMPLANT
TRAY FOLEY CATH 14FR (SET/KITS/TRAYS/PACK) ×3 IMPLANT
WATER STERILE IRR 1000ML POUR (IV SOLUTION) IMPLANT

## 2013-11-24 NOTE — Progress Notes (Signed)
Dr. Mora ApplPinn called and notified of Pts decreased urine output since bolus at 1700. Bladder scanned 160cc. Less than 30cc/hr output. Orders to wait until morning labs.

## 2013-11-24 NOTE — Progress Notes (Signed)
Yolanda EvesMichele M Pierce is a 23 y.o. G1P0000 at 1563w2d by LMP admitted for induction of labor due to Post dates.  Subjective: Patient feeling more uncomfortable despite epidural  Objective: BP 154/100  Pulse 95  Temp(Src) 98.5 F (36.9 C) (Oral)  Resp 18  Ht 5\' 1"  (1.549 m)  Wt 87.544 kg (193 lb)  BMI 36.49 kg/m2  SpO2 99% I/O last 3 completed shifts: In: -  Out: 1900 [Urine:1900]    FHT:  FHR: 140 bpm, variability: moderate,  accelerations:  Present,  decelerations:  Absent UC:   regular, every 2-3 minutes SVE:   Dilation: 6 Effacement (%): 80 Station: -1 Exam by:: Lucas MallowKlashley, RN  Labs: Lab Results  Component Value Date   WBC 9.9 11/21/2013   HGB 10.1* 11/21/2013   HCT 31.9* 11/21/2013   MCV 82.0 11/21/2013   PLT 169 11/21/2013    Assessment / Plan: Arrest in active phase of labor  Labor: Patient has been 6-7cm for more than 12 hours despite >30 units pitocin Preeclampsia:  no signs or symptoms of toxicity Fetal Wellbeing:  Category I Pain Control:  Epidural I/D:  n/a Anticipated MOD:  Discussed with patient that there is likely CPD and I would recommend a primary Cesarean delivery.   Yolanda Pierce, Yolanda Pierce 11/24/2013, 7:37 AM

## 2013-11-24 NOTE — Anesthesia Postprocedure Evaluation (Signed)
  Anesthesia Post-op Note  Patient: Yolanda Pierce  Procedure(s) Performed: Procedure(s): CESAREAN SECTION (N/A)  Patient is awake, responsive, moving her legs, and has signs of resolution of her numbness. Pain and nausea are reasonably well controlled. Vital signs are stable and clinically acceptable. Oxygen saturation is clinically acceptable. There are no apparent anesthetic complications at this time. Patient is ready for discharge.

## 2013-11-24 NOTE — Anesthesia Postprocedure Evaluation (Signed)
  Anesthesia Post-op Note  Patient: Yolanda Pierce  Procedure(s) Performed: Procedure(s): CESAREAN SECTION (N/A)  Patient Location: Mother/Baby  Anesthesia Type:Epidural  Level of Consciousness: awake, alert  and oriented  Airway and Oxygen Therapy: Patient Spontanous Breathing  Post-op Pain: none  Post-op Assessment: Post-op Vital signs reviewed, Patient's Cardiovascular Status Stable, Respiratory Function Stable, No headache, No backache, No residual numbness and No residual motor weakness  Post-op Vital Signs: Reviewed and stable  Last Vitals:  Filed Vitals:   11/24/13 1710  BP: 133/83  Pulse: 112  Temp:   Resp: 19    Complications: No apparent anesthesia complications

## 2013-11-24 NOTE — Addendum Note (Signed)
Addendum created 11/24/13 1814 by Shanon PayorSuzanne M Abdulhamid Olgin, CRNA   Modules edited: Notes Section   Notes Section:  File: [id number]

## 2013-11-24 NOTE — Progress Notes (Signed)
Mom continued to have decreasing urinary output, at 1645 she had an estimated output of 5225ml/hr with the urine an amber color.  Called Dr Mora ApplPinn who ordered 1000ml IV LR bolus, will continue to assess and call with any changes

## 2013-11-24 NOTE — Lactation Note (Signed)
This note was copied from the chart of Yolanda Rosalyn GessMichele Lafond. Lactation Consultation Note  Patient Name: Yolanda Pierce ZOXWR'UToday's Date: 11/24/2013 Reason for consult: Initial assessment  Baby 6 hours of life. Mom reports baby latched earlier after delivery, but having some trouble latching now. Mom is able to hand express copious amounts of colostrum. Baby opens mouth with wide gape. Assisted mom to latch baby to left breast, deeply, baby suckles with rhythmic burst, and intermittent swallows noted. Baby lost nipple several times. Assisted mom to latch baby, baby able to sustain latch when mom compresses breast.Baby suckled off and on for 20 minutes. Discussed with mom what to look for with a good latch. Enc mom to offer lots of STS, latching baby with cues and at least 8-12 times/24 hours. [facility], aware of OP/BFSG and community resources. Enc mom to call for assist as needed.  Maternal Data Has patient been taught Hand Expression?: Yes Does the patient have breastfeeding experience prior to this delivery?: No  Feeding Feeding Type: Breast Fed Length of feed:  (LC assessed first 20 minutes of nursing.)  LATCH Score/Interventions Latch: Repeated attempts needed to sustain latch, nipple held in mouth throughout feeding, stimulation needed to elicit sucking reflex. Intervention(s): Adjust position;Assist with latch;Breast compression  Audible Swallowing: A few with stimulation Intervention(s): Hand expression  Type of Nipple: Flat  Comfort (Breast/Nipple): Soft / non-tender     Hold (Positioning): Assistance needed to correctly position infant at breast and maintain latch.  LATCH Score: 6  Lactation Tools Discussed/Used     Consult Status Consult Status: Follow-up Date: 11/25/13 Follow-up type: In-patient    Yolanda Pierce, Yolanda Pierce 11/24/2013, 4:30 PM

## 2013-11-24 NOTE — Progress Notes (Signed)
Pt still doing ok.  FHTs 120s, g STV, NST R, occ early decels Toco q3-5  SVE 6/90/-1  Continue pitocin.

## 2013-11-24 NOTE — Transfer of Care (Signed)
Immediate Anesthesia Transfer of Care Note  Patient: Yolanda Pierce  Procedure(s) Performed: Procedure(s): CESAREAN SECTION (N/A)  Patient Location: PACU  Anesthesia Type:Epidural  Level of Consciousness: awake, alert , oriented and patient cooperative  Airway & Oxygen Therapy: Patient Spontanous Breathing and Patient connected to nasal cannula oxygen  Post-op Assessment: Report given to PACU RN  Post vital signs: Reviewed and stable  Complications: No apparent anesthesia complications

## 2013-11-24 NOTE — Op Note (Signed)
Cesarean Section Procedure Note  Indications: Patient is a 23 yo G1P0 at 41 weeks 2 days whose induction started 4 days prior, She arrested labor at 6 cm despite adequate contractions. Likely CPD.  Pre-operative Diagnosis: Arrest of labor  Post-operative Diagnosis: same  Surgeon: Caffie Damme   Assistants: none  Anesthesia: epidural anesthesia  ASA Class: 2  Procedure Details   The patient was seen in the Holding Room. The risks, benefits, complications, treatment options, and expected outcomes were discussed with the patient.  The patient concurred with the proposed plan, giving informed consent.  The site of surgery properly noted/marked. The patient was taken to Operating Room # 9, identified as Yolanda Pierce and the procedure verified as C-Section Delivery. A Time Out was held and the above information confirmed.  After induction of anesthesia, the patient was placed in the dorsal supine position with a leftward tilt, draped and prepped in the usual sterile manner. A Pfannenstiel incision was made and carried down through the subcutaneous tissue to the fascia.  The fascia was incised in the midline and the fascial incision was extended laterally with Mayo scissors. The superior aspect of the fascial incision was grasped with Coker clamps x2, tented up and the rectus muscles dissected off sharply with the scalpel. The rectus was then dissected off with blunt dissection and Mayo scissors inferiorly. The rectus muscles were separated in the midline. The abdominal peritoneum was identified, tented up, entered sharply, and the incision was extended superiorly and inferiorly with good visualization of the bladder. The Alexis retractor was then deployed. The vesicouterine peritoneum was identified, tented up, entered sharply with Metzenbaum scissors, and the bladder flap was created digitally. Scalpel was then used to make a low transverse incision on the uterus which was extended laterally  with  blunt dissection. The fetal vertex was identified, delivered easily through the uterine incision followed by the body. The A live female infant was bulb suctioned on the operative field cried vigorously, cord was clamped and cut and the infant was passed to the waiting neonatologist. Apgars 8/9. Placenta was then delivered spontaneously, intact and appear normal, the uterus was cleared of all clot and debris. The uterine incision was repaired with #1 Monocryl in running locked fashion. A second imbricating suture was performed. There were several areas along the uterine incision that were oozing, especially the left lateral edge. Several interrupted figure of eight sutures were placed of 0-monocryl and the uterovesical peritoneum was reapproximated using 3-0 chromic.  Ovaries and tubes were inspected and normal. The Alexis retractor was removed. The abdominal cavity the abdominal cavity was cleared of all clot and debris by irrigation. The abdominal peritoneum was reapproximated with 2-0 Chromic in a running fashion, the rectus muscles was reapproximated with #2 Chromic in interrupted fashion. The fascia was closed with 0 vicryyl in a running fashion from both ends of fascia,  Met in the middle. The subcuticular layer was reapproximated using 2-0 plain gut. The skin was closed with 4-0 vicryl in a subcuticular fashion and dressed with benzoine, steri strips and pressure dressing. All sponge lap and needle counts were correct x2. Patient tolerated the procedure well and recovered in stable condition following the procedure.  Instrument, sponge, and needle counts were correct prior the abdominal closure and at the conclusion of the case.   Findings: Live female infant, Apgars 8/9, clear fluid, normal appearing placenta, normal uterus, bilateral tubes and ovaries  Estimated Blood Loss:  959mL  Drains: Foley catheter                 Specimens: Placenta          Implants: none          Complications:  None; patient tolerated the procedure well.         Disposition: PACU - hemodynamically stable.  Yolanda Pierce

## 2013-11-24 NOTE — Brief Op Note (Signed)
11/21/2013 - 11/24/2013  11:16 AM  PATIENT:  Yolanda EvesMichele M Karren  23 y.o. female  PRE-OPERATIVE DIAGNOSIS:  Arrest of descent  POST-OPERATIVE DIAGNOSIS:  Arrest of descent  PROCEDURE:  Procedure(s): CESAREAN SECTION (N/A)  SURGEON:  Surgeon(s) and Role:    * Essie HartWalda Gaye Scorza, MD - Primary  PHYSICIAN ASSISTANT:   ASSISTANTS: none   ANESTHESIA:   epidural  EBL:  Total I/O In: 3500 [I.V.:3500] Out: 1900 [Urine:1000; Blood:900]  BLOOD ADMINISTERED:none  DRAINS: Urinary Catheter (Foley)   LOCAL MEDICATIONS USED:  NONE  SPECIMEN:  No Specimen  DISPOSITION OF SPECIMEN:  N/A  COUNTS:  YES  TOURNIQUET:  * No tourniquets in log *  DICTATION: .Note written in EPIC  PLAN OF CARE: Admit to inpatient   PATIENT DISPOSITION:  PACU - hemodynamically stable.   Delay start of Pharmacological VTE agent (>24hrs) due to surgical blood loss or risk of bleeding: not applicable  Fabiha Rougeau STACIA

## 2013-11-25 ENCOUNTER — Encounter (HOSPITAL_COMMUNITY): Payer: Self-pay | Admitting: Obstetrics & Gynecology

## 2013-11-25 LAB — BIRTH TISSUE RECOVERY COLLECTION (PLACENTA DONATION)

## 2013-11-25 LAB — CBC
HEMATOCRIT: 21.1 % — AB (ref 36.0–46.0)
Hemoglobin: 7.1 g/dL — ABNORMAL LOW (ref 12.0–15.0)
MCH: 27.1 pg (ref 26.0–34.0)
MCHC: 33.6 g/dL (ref 30.0–36.0)
MCV: 80.5 fL (ref 78.0–100.0)
PLATELETS: 119 10*3/uL — AB (ref 150–400)
RBC: 2.62 MIL/uL — ABNORMAL LOW (ref 3.87–5.11)
RDW: 15.1 % (ref 11.5–15.5)
WBC: 13.5 10*3/uL — ABNORMAL HIGH (ref 4.0–10.5)

## 2013-11-26 MED ORDER — OXYCODONE-ACETAMINOPHEN 5-325 MG PO TABS: 1.0000 | ORAL_TABLET | ORAL | Status: DC | PRN

## 2013-11-26 NOTE — Discharge Summary (Signed)
Obstetric Discharge Summary Reason for Admission: induction of labor Prenatal Procedures: ultrasound Intrapartum Procedures: cesarean: low cervical, transverse Postpartum Procedures: none Complications-Operative and Postpartum: anemia Hemoglobin  Date Value Ref Range Status  11/25/2013 7.1* 12.0 - 15.0 g/dL Final     DELTA CHECK NOTED     REPEATED TO VERIFY     HCT  Date Value Ref Range Status  11/25/2013 21.1* 36.0 - 46.0 % Final    Physical Exam:  General: alert Lochia: appropriate Uterine Fundus: firm Incision: healing well DVT Evaluation: No evidence of DVT seen on physical exam.  Discharge Diagnoses: Term Pregnancy-delivered and CPD  Discharge Information: Date: 11/26/2013 Activity: pelvic rest Diet: routine Medications: PNV, Ibuprofen, Iron and Percocet Condition: stable Instructions: refer to practice specific booklet Discharge to: home Follow-up Information   [facility] Sanjuana MaeWALDA STACIA, MD. Schedule an appointment as soon as possible for a visit in 1 month.   Specialty:  Obstetrics and Gynecology   Contact information:   [address] [place] [id number] [phone]       Newborn Data: Live born female  Birth Weight: 8 lb 3.9 oz (3740 g) APGAR: 8, 9  Home with mother.  Yolanda Pierce E 11/26/2013, 8:10 AM

## 2013-11-26 NOTE — Progress Notes (Signed)
POD#2 Pt states that she is doing well. She would like to go home. VSSAF IMP/ doing well. PLAN/ Will discharge to home.

## 2014-03-14 ENCOUNTER — Encounter (HOSPITAL_COMMUNITY): Payer: Self-pay | Admitting: Obstetrics & Gynecology

## 2014-05-10 ENCOUNTER — Emergency Department: Payer: Self-pay | Admitting: Emergency Medicine

## 2014-05-10 LAB — CBC WITH DIFFERENTIAL/PLATELET
BASOS ABS: 0 10*3/uL (ref 0.0–0.1)
Basophil %: 0.3 %
Eosinophil #: 0 10*3/uL (ref 0.0–0.7)
Eosinophil %: 0.5 %
HCT: 42.1 % (ref 35.0–47.0)
HGB: 13.6 g/dL (ref 12.0–16.0)
Lymphocyte #: 0.3 10*3/uL — ABNORMAL LOW (ref 1.0–3.6)
Lymphocyte %: 4.5 %
MCH: 26.2 pg (ref 26.0–34.0)
MCHC: 32.3 g/dL (ref 32.0–36.0)
MCV: 81 fL (ref 80–100)
MONO ABS: 0.6 x10 3/mm (ref 0.2–0.9)
Monocyte %: 9.2 %
Neutrophil #: 6 10*3/uL (ref 1.4–6.5)
Neutrophil %: 85.5 %
Platelet: 184 10*3/uL (ref 150–440)
RBC: 5.18 10*6/uL (ref 3.80–5.20)
RDW: 16.9 % — ABNORMAL HIGH (ref 11.5–14.5)
WBC: 7 10*3/uL (ref 3.6–11.0)

## 2014-05-10 LAB — COMPREHENSIVE METABOLIC PANEL
ALBUMIN: 4 g/dL (ref 3.4–5.0)
ALT: 27 U/L
ANION GAP: 18 — AB (ref 7–16)
Alkaline Phosphatase: 77 U/L
BILIRUBIN TOTAL: 0.7 mg/dL (ref 0.2–1.0)
BUN: 13 mg/dL (ref 7–18)
CALCIUM: 9.1 mg/dL (ref 8.5–10.1)
CHLORIDE: 107 mmol/L (ref 98–107)
CREATININE: 0.95 mg/dL (ref 0.60–1.30)
Co2: 22 mmol/L (ref 21–32)
EGFR (African American): >60
EGFR (Non-African Amer.): >60
GLUCOSE: 146 mg/dL — AB (ref 65–99)
OSMOLALITY: 295 (ref 275–301)
POTASSIUM: 3.5 mmol/L (ref 3.5–5.1)
SGOT(AST): 26 U/L (ref 15–37)
Sodium: 147 mmol/L — ABNORMAL HIGH (ref 136–145)
Total Protein: 8.1 g/dL (ref 6.4–8.2)

## 2014-07-04 ENCOUNTER — Emergency Department: Payer: Self-pay | Admitting: Emergency Medicine

## 2015-08-30 ENCOUNTER — Emergency Department
Admission: EM | Admit: 2015-08-30 | Discharge: 2015-08-30 | Disposition: A | Discharge status: Home / Self Care | Payer: Medicaid Other | Attending: Emergency Medicine | Admitting: Emergency Medicine

## 2015-08-30 ENCOUNTER — Encounter: Payer: Self-pay | Admitting: Emergency Medicine

## 2015-08-30 DIAGNOSIS — A0811 Acute gastroenteropathy due to Norwalk agent: Secondary | ICD-10-CM | POA: Diagnosis not present

## 2015-08-30 DIAGNOSIS — R112 Nausea with vomiting, unspecified: Secondary | ICD-10-CM | POA: Diagnosis present

## 2015-08-30 LAB — COMPREHENSIVE METABOLIC PANEL
ALBUMIN: 5.1 g/dL — AB (ref 3.5–5.0)
ALT: 13 U/L — ABNORMAL LOW (ref 14–54)
AST: 17 U/L (ref 15–41)
Alkaline Phosphatase: 50 U/L (ref 38–126)
Anion gap: 12 (ref 5–15)
BILIRUBIN TOTAL: 1.2 mg/dL (ref 0.3–1.2)
BUN: 7 mg/dL (ref 6–20)
CO2: 25 mmol/L (ref 22–32)
Calcium: 9.6 mg/dL (ref 8.9–10.3)
Chloride: 104 mmol/L (ref 101–111)
Creatinine, Ser: 0.71 mg/dL (ref 0.44–1.00)
GFR calc Af Amer: >60 mL/min (ref >60)
GFR calc non Af Amer: >60 mL/min (ref >60)
GLUCOSE: 86 mg/dL (ref 65–99)
POTASSIUM: 3.1 mmol/L — AB (ref 3.5–5.1)
SODIUM: 141 mmol/L (ref 135–145)
TOTAL PROTEIN: 7.9 g/dL (ref 6.5–8.1)

## 2015-08-30 LAB — URINALYSIS COMPLETE WITH MICROSCOPIC (ARMC ONLY)
BACTERIA UA: NONE SEEN
Bilirubin Urine: NEGATIVE
GLUCOSE, UA: NEGATIVE mg/dL
Hgb urine dipstick: NEGATIVE
Leukocytes, UA: NEGATIVE
NITRITE: NEGATIVE
PROTEIN: 30 mg/dL — AB
Specific Gravity, Urine: 1.027 (ref 1.005–1.030)
pH: 6 (ref 5.0–8.0)

## 2015-08-30 LAB — PREGNANCY, URINE: PREG TEST UR: NEGATIVE

## 2015-08-30 LAB — CBC
HEMATOCRIT: 42.8 % (ref 35.0–47.0)
Hemoglobin: 14.3 g/dL (ref 12.0–16.0)
MCH: 29.6 pg (ref 26.0–34.0)
MCHC: 33.3 g/dL (ref 32.0–36.0)
MCV: 88.7 fL (ref 80.0–100.0)
Platelets: 166 10*3/uL (ref 150–440)
RBC: 4.82 MIL/uL (ref 3.80–5.20)
RDW: 13.1 % (ref 11.5–14.5)
WBC: 6.1 10*3/uL (ref 3.6–11.0)

## 2015-08-30 LAB — LIPASE, BLOOD: Lipase: 15 U/L (ref 11–51)

## 2015-08-30 MED ORDER — SODIUM CHLORIDE 0.9 % IV SOLN
Freq: Once | INTRAVENOUS | Status: AC
  Administered 2015-08-30: 14:00:00 via INTRAVENOUS

## 2015-08-30 MED ORDER — SODIUM CHLORIDE 0.9 % IV SOLN: Freq: Once | INTRAVENOUS | Status: DC

## 2015-08-30 MED ORDER — ONDANSETRON HCL 4 MG/2ML IJ SOLN
4.0000 mg | Freq: Once | INTRAMUSCULAR | Status: AC
  Administered 2015-08-30: 4 mg via INTRAVENOUS
  Filled 2015-08-30: qty 2

## 2015-08-30 MED ORDER — ONDANSETRON HCL 4 MG PO TABS: 4.0000 mg | ORAL_TABLET | Freq: Every day | ORAL | Status: DC | PRN

## 2015-08-30 NOTE — Discharge Instructions (Signed)
Norovirus Infection °A norovirus infection is caused by exposure to a virus in a group of similar viruses (noroviruses). This type of infection causes inflammation in your stomach and intestines (gastroenteritis). Norovirus is the most common cause of gastroenteritis. It also causes food poisoning. °Anyone can get a norovirus infection. It spreads very easily (contagious). You can get it from contaminated food, water, surfaces, or other people. Norovirus is found in the stool or vomit of infected people. You can spread the infection as soon as you feel sick until 2 weeks after you recover.  °Symptoms usually begin within 2 days after you become infected. Most norovirus symptoms affect the digestive system. °CAUSES °Norovirus infection is caused by contact with norovirus. You can catch norovirus if you: °· Eat or drink something contaminated with norovirus. °· Touch surfaces or objects contaminated with norovirus and then put your hand in your mouth. °· Have direct contact with an infected person who has symptoms. °· Share food, drink, or utensils with someone with who is sick with norovirus. °SIGNS AND SYMPTOMS °Symptoms of norovirus may include: °· Nausea. °· Vomiting. °· Diarrhea. °· Stomach cramps. °· Fever. °· Chills. °· Headache. °· Muscle aches. °· Tiredness. °DIAGNOSIS °Your health care provider may suspect norovirus based on your symptoms and physical exam. Your health care provider may also test a sample of your stool or vomit for the virus.  °TREATMENT °There is no specific treatment for norovirus. Most people get better without treatment in about 2 days. °HOME CARE INSTRUCTIONS °· Replace lost fluids by drinking plenty of water or rehydration fluids containing important minerals called electrolytes. This prevents dehydration. Drink enough fluid to keep your urine clear or pale yellow. °· Do not prepare food for others while you are infected. Wait at least 3 days after recovering from the illness to do  that. °PREVENTION  °· Wash your hands often, especially after using the toilet or changing a diaper. °· Wash fruits and vegetables thoroughly before preparing or serving them. °· Throw out any food that a sick person may have touched. °· Disinfect contaminated surfaces immediately after someone in the household has been sick. Use a bleach-based household cleaner. °· Immediately remove and wash soiled clothes or sheets. °SEEK MEDICAL CARE IF: °· Your vomiting, diarrhea, and stomach pain is getting worse. °· Your symptoms of norovirus do not go away after 2-3 days. °SEEK IMMEDIATE MEDICAL CARE IF:  °You develop symptoms of dehydration that do not improve with fluid replacement. This may include: °· Excessive sleepiness. °· Lack of tears. °· Dry mouth. °· Dizziness when standing. °· Weak pulse. °  °This information is not intended to replace advice given to you by your health care provider. Make sure you discuss any questions you have with your health care provider. °  °Document Released: 07/20/2002 Document Revised: 05/20/2014 Document Reviewed: 10/07/2013 °Elsevier Interactive Patient Education ©2016 [employer] ° °

## 2015-08-30 NOTE — ED Provider Notes (Signed)
[facility] Emergency Department Provider Note     Time seen: ----------------------------------------- 1:30 PM on 08/30/2015 -----------------------------------------    I have reviewed the triage vital signs and the nursing notes.   HISTORY  Chief Complaint Emesis    HPI Yolanda Pierce is a 25 y.o. female who presents ER for nausea vomiting and diarrhea for the past week. Patient states the diarrhea started first, she subsequently had vomiting and diarrhea for 2-3 days and has not really keep anything down. She was concerned she may be pregnant, her sexual partner was not using protection she was not aware of this. She's not had any fevers or chills, feels dehydrated.   Past Medical History  Diagnosis Date  . Anemia     On Iron tablets,once daily    Patient Active Problem List   Diagnosis Date Noted  . Status post cesarean delivery 11/24/2013  . Labor and delivery indication for care or intervention 11/21/2013    Past Surgical History  Procedure Laterality Date  . No past surgeries    . Cesarean section N/A 11/24/2013    Procedure: CESAREAN SECTION;  Surgeon: Essie HartWalda Pinn, MD;  Location: [facility];  Service: Obstetrics;  Laterality: N/A;  . Cesarean section  2015    Allergies Amoxicillin  Social History Social History  Substance Use Topics  . Smoking status: Never Smoker   . Smokeless tobacco: Never Used  . Alcohol Use: Yes     Comment: only social use before pregnancy    Review of Systems Constitutional: Negative for fever. Eyes: Negative for visual changes. ENT: Negative for sore throat. Cardiovascular: Negative for chest pain. Respiratory: Negative for shortness of breath. Gastrointestinal: Positive for abdominal pain, vomiting and diarrhea Genitourinary: Negative for dysuria. Musculoskeletal: Negative for back pain. Skin: Negative for rash. Neurological: Negative for headaches, focal weakness or numbness.  10-point  ROS otherwise negative.  ____________________________________________   PHYSICAL EXAM:  VITAL SIGNS: ED Triage Vitals  Enc Vitals Group     BP 08/30/15 1259 114/62 mmHg     Pulse Rate 08/30/15 1259 76     Resp 08/30/15 1259 20     Temp 08/30/15 1259 98.5 F (36.9 C)     Temp Source 08/30/15 1259 Oral     SpO2 08/30/15 1259 100 %     Weight 08/30/15 1259 167 lb (75.751 kg)     Height 08/30/15 1259 5\' 2"  (1.575 m)     Head Cir --      Peak Flow --      Pain Score 08/30/15 1300 8     Pain Loc --      Pain Edu? --      Excl. in GC? --     Constitutional: Alert and oriented. Well appearing and in no distress. Eyes: Conjunctivae are normal. PERRL. Normal extraocular movements. ENT   Head: Normocephalic and atraumatic.   Nose: No congestion/rhinnorhea.   Mouth/Throat: Mucous membranes are moist.   Neck: No stridor. Cardiovascular: Normal rate, regular rhythm. No murmurs, rubs, or gallops. Respiratory: Normal respiratory effort without tachypnea nor retractions. Breath sounds are clear and equal bilaterally. No wheezes/rales/rhonchi. Gastrointestinal: Soft and nontender. Normal bowel sounds Musculoskeletal: Nontender with normal range of motion in all extremities. No lower extremity tenderness nor edema. Neurologic:  Normal speech and language. No gross focal neurologic deficits are appreciated.  Skin:  Skin is warm, dry and intact. No rash noted. Psychiatric: Mood and affect are normal. Speech and behavior are normal.  ____________________________________________  ED COURSE:  Pertinent labs & imaging results that were available during my care of the patient were reviewed by me and considered in my medical decision making (see chart for details). Patient is in no acute distress, patient will receive IV fluids antiemetics and basic labs. ____________________________________________    LABS (pertinent positives/negatives)  Labs Reviewed  COMPREHENSIVE METABOLIC  PANEL - Abnormal; Notable for the following:    Potassium 3.1 (*)    Albumin 5.1 (*)    ALT 13 (*)    All other components within normal limits  URINALYSIS COMPLETEWITH MICROSCOPIC ([facility] ONLY) - Abnormal; Notable for the following:    Color, Urine AMBER (*)    APPearance HAZY (*)    Ketones, ur 2+ (*)    Protein, ur 30 (*)    Squamous Epithelial / LPF 6-30 (*)    All other components within normal limits  LIPASE, BLOOD  CBC  PREGNANCY, URINE  ____________________________________________  FINAL ASSESSMENT AND PLAN  Norovirus infection  Plan: Patient with labs as dictated above. Patient was given antiemetics and has unremarkable lab tests. She is stable for outpatient follow-up with her doctor.   Emily Filbert, MD   Emily Filbert, MD 08/31/15 [phone]

## 2015-08-30 NOTE — ED Notes (Addendum)
Patient presents to the ED with nausea, vomiting x 2-3 days and diarrhea for the past week.  Patient states her period is 2 days late.  Patient is complaining of right upper quadrant and upper centralized abdominal pain.  Patient is in no obvious distress at this time.  Patient reports vomiting x 3 today.

## 2015-11-28 ENCOUNTER — Encounter: Payer: Self-pay | Admitting: Obstetrics and Gynecology

## 2015-11-28 ENCOUNTER — Ambulatory Visit (INDEPENDENT_AMBULATORY_CARE_PROVIDER_SITE_OTHER): Payer: Medicaid Other | Admitting: Obstetrics and Gynecology

## 2015-11-28 VITALS — BP 97/55 | HR 69 | Ht 61.0 in | Wt 148.7 lb

## 2015-11-28 DIAGNOSIS — F419 Anxiety disorder, unspecified: Secondary | ICD-10-CM

## 2015-11-28 DIAGNOSIS — Z148 Genetic carrier of other disease: Secondary | ICD-10-CM | POA: Insufficient documentation

## 2015-11-28 DIAGNOSIS — Z87898 Personal history of other specified conditions: Secondary | ICD-10-CM

## 2015-11-28 DIAGNOSIS — F418 Other specified anxiety disorders: Secondary | ICD-10-CM

## 2015-11-28 DIAGNOSIS — F329 Major depressive disorder, single episode, unspecified: Secondary | ICD-10-CM

## 2015-11-28 DIAGNOSIS — F32A Depression, unspecified: Secondary | ICD-10-CM

## 2015-11-28 DIAGNOSIS — Z3491 Encounter for supervision of normal pregnancy, unspecified, first trimester: Secondary | ICD-10-CM | POA: Diagnosis not present

## 2015-11-28 DIAGNOSIS — F1291 Cannabis use, unspecified, in remission: Secondary | ICD-10-CM

## 2015-11-28 DIAGNOSIS — N912 Amenorrhea, unspecified: Secondary | ICD-10-CM

## 2015-11-28 DIAGNOSIS — Z98891 History of uterine scar from previous surgery: Secondary | ICD-10-CM | POA: Diagnosis not present

## 2015-11-28 DIAGNOSIS — O219 Vomiting of pregnancy, unspecified: Secondary | ICD-10-CM

## 2015-11-28 DIAGNOSIS — Z3490 Encounter for supervision of normal pregnancy, unspecified, unspecified trimester: Secondary | ICD-10-CM | POA: Insufficient documentation

## 2015-11-28 LAB — POCT URINE PREGNANCY: Preg Test, Ur: POSITIVE — AB

## 2015-11-28 MED ORDER — DOXYLAMINE SUCCINATE (SLEEP) 25 MG PO TABS: 12.5000 mg | ORAL_TABLET | Freq: Every evening | ORAL | Status: DC | PRN

## 2015-11-28 MED ORDER — VITAMIN B-6 25 MG PO TABS: 25.0000 mg | ORAL_TABLET | Freq: Three times a day (TID) | ORAL | Status: DC

## 2015-11-28 NOTE — Patient Instructions (Signed)
1. Prenatal vitamins daily 2. Patient has been counseled regarding THC use in pregnancy regarding fetal neurologic development as well as impact on newborn after delivery if breast-feeding. Patient strongly encouraged to avoid further THC use 3. History of prior cesarean section for failure to progress; recommend repeat C-section at 39 weeks 4. Continue with Lexapro and Wellbutrin XL until follow-up with psychiatrist. Recommend discontinuation of Wellbutrin and possible switching of Lexapro to Zoloft in the near future. We'll defer to psychiatry expertise for further medication adjustments. 5. 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 Patient does understand that subsequent children have potential for being a carrier for this gene or have potential for being affected due to the autosomal recessive nature of the gene. 6. Return in 3 weeks for new OB nursing visit 7. Return in 5 weeks for new OB history and physical 8. New OB counseling: The patient has been given an overview regarding routine prenatal care. Recommendations regarding diet, weight gain, and exercise in pregnancy were given. Prenatal testing, optional genetic testing, and ultrasound use in pregnancy were reviewed.  Benefits of Breast Feeding were discussed. The patient is encouraged to consider nursing her baby post partum.

## 2015-11-28 NOTE — Progress Notes (Signed)
GYN ENCOUNTER NOTE  Subjective:       Yolanda Pierce is a 25 y.o. [id number] female is here for gynecologic evaluation of the following issues:  1. Pregnancy confirmation.    LMP 10/12/2015 EDD 07/18/2016 EGA 6.5 weeks  UPT-positive  Obstetric risk factors:  1. History of prior C-section; failure to progress at 6 cm  2. History of THC use  3.  Nausea and vomiting of pregnancy  4. 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 Anxiety/depression; on Lexapro and Wellbutrin XL for the past 6 months, managed by psychiatry  Gynecologic History Patient's last menstrual period was 10/12/2015 (exact date). Contraception: none   Obstetric History OB History  Gravida Para Term Preterm AB SAB TAB Ectopic Multiple Living  2 1 1  0 0 0 0 0 0 1    # Outcome Date GA Lbr Len/2nd Weight Sex Delivery Anes PTL Lv  2 Current           1 Term 11/24/13 [id number]  8 lb 3.9 oz (3.74 kg) F CS-LTranv EPI  Y      Past Medical History  Diagnosis Date  . Anemia     On Iron tablets,once daily  . Anxiety   . Depression     Past Surgical History  Procedure Laterality Date  . No past surgeries    . Cesarean section N/A 11/24/2013    Procedure: CESAREAN SECTION;  Surgeon: Essie HartWalda Pinn, MD;  Location: [facility];  Service: Obstetrics;  Laterality: N/A;  . Cesarean section  2015    Current Outpatient Prescriptions on File Prior to Visit  Medication Sig Dispense Refill  . Prenatal Vit-Fe Fumarate-FA (PRENATAL MULTIVITAMIN) TABS tablet Take 1 tablet by mouth daily at 12 noon.     No current facility-administered medications on file prior to visit.    Allergies  Allergen Reactions  . Amoxicillin Rash    Redness and rash all over    Social History   Social History  . Marital Status: Single    Spouse Name: N/A  . Number of Children: N/A  .  Years of Education: N/A   Occupational History  . Not on file.   Social History Main Topics  . Smoking status: Never Smoker   . Smokeless tobacco: Never Used  . Alcohol Use: No  . Drug Use: Yes    Special: Marijuana     Comment: used 2 weeks ago  . Sexual Activity: Yes    Birth Control/ Protection: None   Other Topics Concern  . Not on file   Social History Narrative    Family History  Problem Relation Age of Onset  . Asthma Mother   . Hypertension Mother   . Diabetes Father   . Hypertension Father   . Cancer Neg Hx     The following portions of the patient's history were reviewed and updated as appropriate: allergies, current medications, past family history, past medical history, past social history, past surgical history and problem list.  Review of Systems Review of Systems -Positive for nausea and vomiting; negative for pelvic pain or vaginal bleeding Review of Systems - General ROS: negative for - chills, fatigue, fever, hot flashes, malaise or night sweats Hematological and Lymphatic ROS: negative for - bleeding problems or swollen lymph nodes Gastrointestinal ROS: negative for - abdominal pain, blood in stools, change in bowel habits  Musculoskeletal ROS: negative for - joint pain, muscle pain or muscular weakness Genito-Urinary ROS: negative for - change in menstrual cycle, dysmenorrhea, dyspareunia, dysuria, genital discharge, genital ulcers, hematuria, incontinence, irregular/heavy menses, nocturia or pelvic pain  Objective:   BP 97/55 mmHg  Pulse 69  Ht  (1.549 m)  Wt 148 lb 11.2 oz (67.45 kg)  BMI 28.11 kg/m2  LMP 10/12/2015 (Exact Date) Physical exam deferred    Assessment:   1. Amenorrhea - POCT urine pregnancy  2. History of THC use; stopped upon finding positive home pregnancy test  3. History of cesarean section; failure to progress at 6 cm  4. Anxiety/depression; on Lexapro and Wellbutrin XL, managed by psychiatry  5. 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 Nausea and vomiting during pregnancy     Plan:   1. Prenatal vitamins daily 2. Patient has been counseled regarding THC use in pregnancy regarding fetal neurologic development as well as impact on newborn after delivery if breast-feeding. Patient strongly encouraged to avoid further THC use 3. History of prior cesarean section for failure to progress; recommend repeat C-section at 39 weeks 4. Continue with Lexapro and Wellbutrin XL until follow-up with psychiatrist. Recommend discontinuation of Wellbutrin and possible switching of Lexapro to Zoloft in the near future. We'll defer to psychiatry expertise for further medication adjustments. 5. 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 Patient does understand that subsequent children have potential for being a carrier for this gene or have potential for being affected due to the autosomal recessive nature of the gene. 6. Return in 3 weeks for new OB nursing visit 7. Return in 5 weeks for new OB history and physical 8. New OB counseling: The patient has been given an overview regarding routine prenatal care. Recommendations regarding diet, weight gain, and exercise in pregnancy were given. Prenatal testing, optional genetic testing, and ultrasound use in pregnancy were reviewed.  Benefits of Breast Feeding were discussed. The patient is encouraged to consider nursing her baby post partum.  A total of 30 minutes were spent face-to-face with the patient during the encounter with greater than 50% dealing with counseling and coordination of care.  Herold Harms, MD  Note: This dictation was prepared with Dragon dictation along with smaller phrase technology. Any transcriptional errors that result from this process are unintentional.

## 2015-12-18 ENCOUNTER — Ambulatory Visit: Payer: Medicaid Other | Admitting: Obstetrics and Gynecology

## 2015-12-18 VITALS — BP 98/63 | HR 84 | Wt 148.8 lb

## 2015-12-18 DIAGNOSIS — Z369 Encounter for antenatal screening, unspecified: Secondary | ICD-10-CM

## 2015-12-18 DIAGNOSIS — Z1389 Encounter for screening for other disorder: Secondary | ICD-10-CM

## 2015-12-18 DIAGNOSIS — Z3490 Encounter for supervision of normal pregnancy, unspecified, unspecified trimester: Secondary | ICD-10-CM

## 2015-12-18 DIAGNOSIS — O219 Vomiting of pregnancy, unspecified: Secondary | ICD-10-CM

## 2015-12-18 DIAGNOSIS — Z113 Encounter for screening for infections with a predominantly sexual mode of transmission: Secondary | ICD-10-CM

## 2015-12-18 MED ORDER — ONDANSETRON HCL 4 MG PO TABS: 4.0000 mg | ORAL_TABLET | Freq: Three times a day (TID) | ORAL | 1 refills | Status: DC | PRN

## 2015-12-18 NOTE — Patient Instructions (Signed)
Pregnancy and Zika Virus Disease Zika virus disease, or Zika, is an illness that can spread to people from mosquitoes that carry the virus. It may also spread from person to person through infected body fluids. Zika first occurred in [place], but recently it has spread to new areas. The virus occurs in tropical climates. The location of Zika continues to change. Most people who become infected with Zika virus do not develop serious illness. However, Zika may cause birth defects in an unborn baby whose mother is infected with the virus. It may also increase the risk of miscarriage. WHAT ARE THE SYMPTOMS OF ZIKA VIRUS DISEASE? In many cases, people who have been infected with Zika virus do not develop any symptoms. If symptoms appear, they usually start about a week after the person is infected. Symptoms are usually mild. They may include:  Fever.  Rash.  Red eyes.  Joint pain. HOW DOES ZIKA VIRUS DISEASE SPREAD? The main way that Zika virus spreads is through the bite of a certain type of mosquito. Unlike most types of mosquitos, which bite only at night, the type of mosquito that carries Zika virus bites both at night and during the day. Zika virus can also spread through sexual contact, through a blood transfusion, and from a mother to her baby before or during birth. Once you have had Zika virus disease, it is unlikely that you will get it again. CAN I PASS ZIKA TO MY BABY DURING PREGNANCY? Yes, Zika can pass from a mother to her baby before or during birth. WHAT PROBLEMS CAN ZIKA CAUSE FOR MY BABY? A woman who is infected with Zika virus while pregnant is at risk of having her baby born with a condition in which the brain or head is smaller than expected (microcephaly). Babies who have microcephaly can have developmental delays, seizures, hearing problems, and vision problems. Having Zika virus disease during pregnancy can also increase the risk of miscarriage. HOW CAN ZIKA VIRUS DISEASE BE  PREVENTED? There is no vaccine to prevent Zika. The best way to prevent the disease is to avoid infected mosquitoes and avoid exposure to body fluids that can spread the virus. Avoid any possible exposure to Zika by taking the following precautions. For women and their sex partners:  Avoid traveling to high-risk areas. The locations where Zika is being reported change often. To identify high-risk areas, check the CDC travel website: www.cdc.gov/zika/geo/index.html  If you or your sex partner must travel to a high-risk area, talk with a health care provider before and after traveling.  Take all precautions to avoid mosquito bites if you live in, or travel to, any of the high-risk areas. Insect repellents are safe to use during pregnancy.  Ask your health care provider when it is safe to have sexual contact. For women:  If you are pregnant or trying to become pregnant, avoid sexual contact with persons who may have been exposed to Zika virus, persons who have possible symptoms of Zika, or persons whose history you are unsure about. If you choose to have sexual contact with someone who may have been exposed to Zika virus, use condoms correctly during the entire duration of sexual activity, every time. Do not share sexual devices, as you may be exposed to body fluids.  Ask your health care provider about when it is safe to attempt pregnancy after a possible exposure to Zika virus. WHAT STEPS SHOULD I TAKE TO AVOID MOSQUITO BITES? Take these steps to avoid mosquito bites when you are   in a high-risk area:  Wear loose clothing that covers your arms and legs.  Limit your outdoor activities.  Do not open windows unless they have window screens.  Sleep under mosquito nets.  Use insect repellent. The best insect repellents have:  DEET, picaridin, oil of lemon eucalyptus (OLE), or IR3535 in them.  Higher amounts of an active ingredient in them.  Remember that insect repellents are safe to use  during pregnancy.  Do not use OLE on children who are younger than 3 years of age. Do not use insect repellent on babies who are younger than 2 months of age.  Cover your child's stroller with mosquito netting. Make sure the netting fits snugly and that any loose netting does not cover your child's mouth or nose. Do not use a blanket as a mosquito-protection cover.  Do not apply insect repellent underneath clothing.  If you are using sunscreen, apply the sunscreen before applying the insect repellent.  Treat clothing with permethrin. Do not apply permethrin directly to your skin. Follow label directions for safe use.  Get rid of standing water, where mosquitoes may reproduce. Standing water is often found in items such as buckets, bowls, animal food dishes, and flowerpots. When you return from traveling to any high-risk area, continue taking actions to protect yourself against mosquito bites for 3 weeks, even if you show no signs of illness. This will prevent spreading Zika virus to uninfected mosquitoes. WHAT SHOULD I KNOW ABOUT THE SEXUAL TRANSMISSION OF ZIKA? People can spread Zika to their sexual partners during vaginal, anal, or oral sex, or by sharing sexual devices. Many people with Zika do not develop symptoms, so a person could spread the disease without knowing that they are infected. The greatest risk is to women who are pregnant or who may become pregnant. Zika virus can live longer in semen than it can live in blood. Couples can prevent sexual transmission of the virus by:  Using condoms correctly during the entire duration of sexual activity, every time. This includes vaginal, anal, and oral sex.  Not sharing sexual devices. Sharing increases your risk of being exposed to body fluid from another person.  Avoiding all sexual activity until your health care provider says it is safe. SHOULD I BE TESTED FOR ZIKA VIRUS? A sample of your blood can be tested for Zika virus. A pregnant  woman should be tested if she may have been exposed to the virus or if she has symptoms of Zika. She may also have additional tests done during her pregnancy, such ultrasound testing. Talk with your health care provider about which tests are recommended.   This information is not intended to replace advice given to you by your health care provider. Make sure you discuss any questions you have with your health care provider.   Document Released: 01/18/2015 Document Reviewed: 01/11/2015 Elsevier Interactive Patient Education 2016 [employer] Minor Illnesses and Medications in Pregnancy  Cold/Flu:  Sudafed for congestion- Robitussin (plain) for cough- Tylenol for discomfort.  Please follow the directions on the label.  Try not to take any more than needed.  OTC Saline nasal spray and air humidifier or cool-mist  Vaporizer to sooth nasal irritation and to loosen congestion.  It is also important to increase intake of non carbonated fluids, especially if you have a fever.  Constipation:  Colace-2 capsules at bedtime; Metamucil- follow directions on label; Senokot- 1 tablet at bedtime.  Any one of these medications can be used.  It is also   very important to increase fluids and fruits along with regular exercise.  If problem persists please call the office.  Diarrhea:  Kaopectate as directed on the label.  Eat a bland diet and increase fluids.  Avoid highly seasoned foods.  Headache:  Tylenol 1 or 2 tablets every 3-4 hours as needed  Indigestion:  Maalox, Mylanta, Tums or Rolaids- as directed on label.  Also try to eat small meals and avoid fatty, greasy or spicy foods.  Nausea with or without Vomiting:  Nausea in pregnancy is caused by increased levels of hormones in the body which influence the digestive system and cause irritation when stomach acids accumulate.  Symptoms usually subside after 1st trimester of pregnancy.  Try the following: 1. Keep saltines, graham crackers or dry toast by your bed  to eat upon awakening. 2. Don't let your stomach get empty.  Try to eat 5-6 small meals per day instead of 3 large ones. 3. Avoid greasy fatty or highly seasoned foods.  4. Take OTC Unisom 1 tablet at bed time along with OTC Vitamin B6 25-50 mg 3 times per day.    If nausea continues with vomiting and you are unable to keep down food and fluids you may need a prescription medication.  Please notify your provider.   Sore throat:  Chloraseptic spray, throat lozenges and or plain Tylenol.  Vaginal Yeast Infection:  OTC Monistat for 7 days as directed on label.  If symptoms do not resolve within a week notify provider.  If any of the above problems do not subside with recommended treatment please call the office for further assistance.   Do not take Aspirin, Advil, Motrin or Ibuprofen.  * * OTC= Over the counter Hyperemesis Gravidarum Hyperemesis gravidarum is a severe form of nausea and vomiting that happens during pregnancy. Hyperemesis is worse than morning sickness. It may cause you to have nausea or vomiting all day for many days. It may keep you from eating and drinking enough food and liquids. Hyperemesis usually occurs during the first half (the first 20 weeks) of pregnancy. It often goes away once a woman is in her second half of pregnancy. However, sometimes hyperemesis continues through an entire pregnancy.  CAUSES  The cause of this condition is not completely known but is thought to be related to changes in the body's hormones when pregnant. It could be from the high level of the pregnancy hormone or an increase in estrogen in the body.  SIGNS AND SYMPTOMS   Severe nausea and vomiting.  Nausea that does not go away.  Vomiting that does not allow you to keep any food down.  Weight loss and body fluid loss (dehydration).  Having no desire to eat or not liking food you have previously enjoyed. DIAGNOSIS  Your health care provider will do a physical exam and ask you about your  symptoms. He or she may also order blood tests and urine tests to make sure something else is not causing the problem.  TREATMENT  You may only need medicine to control the problem. If medicines do not control the nausea and vomiting, you will be treated in the hospital to prevent dehydration, increased acid in the blood (acidosis), weight loss, and changes in the electrolytes in your body that may harm the unborn baby (fetus). You may need IV fluids.  HOME CARE INSTRUCTIONS   Only take over-the-counter or prescription medicines as directed by your health care provider.  Try eating a couple of dry crackers or   toast in the morning before getting out of bed.  Avoid foods and smells that upset your stomach.  Avoid fatty and spicy foods.  Eat 5-6 small meals a day.  Do not drink when eating meals. Drink between meals.  For snacks, eat high-protein foods, such as cheese.  Eat or suck on things that have ginger in them. Ginger helps nausea.  Avoid food preparation. The smell of food can spoil your appetite.  Avoid iron pills and iron in your multivitamins until after 3-4 months of being pregnant. However, consult with your health care provider before stopping any prescribed iron pills. SEEK MEDICAL CARE IF:   Your abdominal pain increases.  You have a severe headache.  You have vision problems.  You are losing weight. SEEK IMMEDIATE MEDICAL CARE IF:   You are unable to keep fluids down.  You vomit blood.  You have constant nausea and vomiting.  You have excessive weakness.  You have extreme thirst.  You have dizziness or fainting.  You have a fever or persistent symptoms for more than 2-3 days.  You have a fever and your symptoms suddenly get worse. MAKE SURE YOU:   Understand these instructions.  Will watch your condition.  Will get help right away if you are not doing well or get worse.   This information is not intended to replace advice given to you by your  health care provider. Make sure you discuss any questions you have with your health care provider.   Document Released: 04/29/2005 Document Revised: 02/17/2013 Document Reviewed: 12/09/2012 Elsevier Interactive Patient Education 2016 Elsevier Inc. Commonly Asked Questions During Pregnancy  Cats: A parasite can be excreted in cat feces.  To avoid exposure you need to have another person empty the little box.  If you must empty the litter box you will need to wear gloves.  Wash your hands after handling your cat.  This parasite can also be found in raw or undercooked meat so this should also be avoided.  Colds, Sore Throats, Flu: Please check your medication sheet to see what you can take for symptoms.  If your symptoms are unrelieved by these medications please call the office.  Dental Work: Most any dental work your dentist recommends is permitted.  X-rays should only be taken during the first trimester if absolutely necessary.  Your abdomen should be shielded with a lead apron during all x-rays.  Please notify your provider prior to receiving any x-rays.  Novocaine is fine; gas is not recommended.  If your dentist requires a note from us prior to dental work please call the office and we will provide one for you.  Exercise: Exercise is an important part of staying healthy during your pregnancy.  You may continue most exercises you were accustomed to prior to pregnancy.  Later in your pregnancy you will most likely notice you have difficulty with activities requiring balance like riding a bicycle.  It is important that you listen to your body and avoid activities that put you at a higher risk of falling.  Adequate rest and staying well hydrated are a must!  If you have questions about the safety of specific activities ask your provider.    Exposure to Children with illness: Try to avoid obvious exposure; report any symptoms to us when noted,  If you have chicken pos, red measles or mumps, you should  be immune to these diseases.   Please do not take any vaccines while pregnant unless you have checked with   your OB provider.  Fetal Movement: After 28 weeks we recommend you do "kick counts" twice daily.  Lie or sit down in a calm quiet environment and count your baby movements "kicks".  You should feel your baby at least 10 times per hour.  If you have not felt 10 kicks within the first hour get up, walk around and have something sweet to eat or drink then repeat for an additional hour.  If count remains less than 10 per hour notify your provider.  Fumigating: Follow your pest control agent's advice as to how long to stay out of your home.  Ventilate the area well before re-entering.  Hemorrhoids:   Most over-the-counter preparations can be used during pregnancy.  Check your medication to see what is safe to use.  It is important to use a stool softener or fiber in your diet and to drink lots of liquids.  If hemorrhoids seem to be getting worse please call the office.   Hot Tubs:  Hot tubs Jacuzzis and saunas are not recommended while pregnant.  These increase your internal body temperature and should be avoided.  Intercourse:  Sexual intercourse is safe during pregnancy as long as you are comfortable, unless otherwise advised by your provider.  Spotting may occur after intercourse; report any bright red bleeding that is heavier than spotting.  Labor:  If you know that you are in labor, please go to the hospital.  If you are unsure, please call the office and let us help you decide what to do.  Lifting, straining, etc:  If your job requires heavy lifting or straining please check with your provider for any limitations.  Generally, you should not lift items heavier than that you can lift simply with your hands and arms (no back muscles)  Painting:  Paint fumes do not harm your pregnancy, but may make you ill and should be avoided if possible.  Latex or water based paints have less odor than oils.   Use adequate ventilation while painting.  Permanents & Hair Color:  Chemicals in hair dyes are not recommended as they cause increase hair dryness which can increase hair loss during pregnancy.  " Highlighting" and permanents are allowed.  Dye may be absorbed differently and permanents may not hold as well during pregnancy.  Sunbathing:  Use a sunscreen, as skin burns easily during pregnancy.  Drink plenty of fluids; avoid over heating.  Tanning Beds:  Because their possible side effects are still unknown, tanning beds are not recommended.  Ultrasound Scans:  Routine ultrasounds are performed at approximately 20 weeks.  You will be able to see your baby's general anatomy an if you would like to know the gender this can usually be determined as well.  If it is questionable when you conceived you may also receive an ultrasound early in your pregnancy for dating purposes.  Otherwise ultrasound exams are not routinely performed unless there is a medical necessity.  Although you can request a scan we ask that you pay for it when conducted because insurance does not cover " patient request" scans.  Work: If your pregnancy proceeds without complications you may work until your due date, unless your physician or employer advises otherwise.  Round Ligament Pain/Pelvic Discomfort:  Sharp, shooting pains not associated with bleeding are fairly common, usually occurring in the second trimester of pregnancy.  They tend to be worse when standing up or when you remain standing for long periods of time.  These are the result   of pressure of certain pelvic ligaments called "round ligaments".  Rest, Tylenol and heat seem to be the most effective relief.  As the womb and fetus grow, they rise out of the pelvis and the discomfort improves.  Please notify the office if your pain seems different than that described.  It may represent a more serious condition.   

## 2015-12-18 NOTE — Progress Notes (Signed)
[facility]2.  P-1001. Pregnancy confirmation by Dr. Greggory KeeneFrancesco on 11/28/2015. LMP: 10/12/2015. Pregnancy education material explained and given. No cats in the home. NOB labs ordered.  HIV labs and Drug screen were explained optional and she could opt out of tests but did not decline. Drug screen ordered. PNV encouraged.  FOB-father had a twin that lived 363-months and Maternal grand father-cleft palate. FOB: deletion of 15 th chromosome and 19 th chromosome GA1. Pt states n/v usually 1xd as to where the Vitamin B6 is not helping. Zofran ordered. Pt works so would be unable to take phenergan and has 2 yr. Old. Pt states she stopped both her meds: Wellbutrin XL and Lexapro about 3 weeks ago. She states at first it was rough but she is now okay.  Pt. To follow up with provider in 2 weeks for NOB physical.  All questions answered.

## 2015-12-20 LAB — CBC WITH DIFFERENTIAL/PLATELET
BASOS ABS: 0 10*3/uL (ref 0.0–0.2)
Basos: 0 %
EOS (ABSOLUTE): 0.1 10*3/uL (ref 0.0–0.4)
EOS: 1 %
HEMOGLOBIN: 12.4 g/dL (ref 11.1–15.9)
Hematocrit: 37.5 % (ref 34.0–46.6)
IMMATURE GRANS (ABS): 0 10*3/uL (ref 0.0–0.1)
IMMATURE GRANULOCYTES: 0 %
Lymphocytes Absolute: 1.2 10*3/uL (ref 0.7–3.1)
Lymphs: 18 %
MCH: 29.6 pg (ref 26.6–33.0)
MCHC: 33.1 g/dL (ref 31.5–35.7)
MCV: 90 fL (ref 79–97)
Monocytes Absolute: 0.5 10*3/uL (ref 0.1–0.9)
Monocytes: 8 %
NEUTROS PCT: 73 %
Neutrophils Absolute: 5 10*3/uL (ref 1.4–7.0)
Platelets: 190 10*3/uL (ref 150–379)
RBC: 4.19 x10E6/uL (ref 3.77–5.28)
RDW: 13.1 % (ref 12.3–15.4)
WBC: 6.8 10*3/uL (ref 3.4–10.8)

## 2015-12-20 LAB — VARICELLA ZOSTER ANTIBODY, IGG: Varicella zoster IgG: 1504 index (ref >165)

## 2015-12-20 LAB — RH TYPE: RH TYPE: POSITIVE

## 2015-12-20 LAB — HIV ANTIBODY (ROUTINE TESTING W REFLEX): HIV Screen 4th Generation wRfx: NONREACTIVE

## 2015-12-20 LAB — ABO

## 2015-12-20 LAB — HEPATITIS B SURFACE ANTIGEN: HEP B S AG: NEGATIVE

## 2015-12-20 LAB — RUBELLA SCREEN: RUBELLA: 1.06 {index} (ref >0.99)

## 2015-12-20 LAB — RPR: RPR: NONREACTIVE

## 2015-12-20 LAB — URINE CULTURE, OB REFLEX

## 2015-12-20 LAB — ANTIBODY SCREEN: Antibody Screen: NEGATIVE

## 2015-12-20 LAB — GC/CHLAMYDIA PROBE AMP
Chlamydia trachomatis, NAA: NEGATIVE
Neisseria gonorrhoeae by PCR: NEGATIVE

## 2015-12-20 LAB — CULTURE, OB URINE

## 2015-12-22 LAB — URINALYSIS, ROUTINE W REFLEX MICROSCOPIC
BILIRUBIN UA: NEGATIVE
GLUCOSE, UA: NEGATIVE
KETONES UA: NEGATIVE
LEUKOCYTES UA: NEGATIVE
Nitrite, UA: NEGATIVE
PROTEIN UA: NEGATIVE
RBC UA: NEGATIVE
SPEC GRAV UA: 1.026 (ref 1.005–1.030)
Urobilinogen, Ur: 0.2 mg/dL (ref 0.2–1.0)
pH, UA: 7.5 (ref 5.0–7.5)

## 2015-12-22 LAB — NICOTINE SCREEN, URINE: Cotinine Ql Scrn, Ur: NEGATIVE ng/mL

## 2015-12-22 LAB — MONITOR DRUG PROFILE 14(MW)
AMPHETAMINE SCREEN URINE: NEGATIVE ng/mL
BARBITURATE SCREEN URINE: NEGATIVE ng/mL
BENZODIAZEPINE SCREEN, URINE: NEGATIVE ng/mL
BUPRENORPHINE, URINE: NEGATIVE ng/mL
COCAINE(METAB.)SCREEN, URINE: NEGATIVE ng/mL
Creatinine(Crt), U: 187.4 mg/dL (ref 20.0–300.0)
Fentanyl, Urine: NEGATIVE pg/mL
MEPERIDINE SCREEN, URINE: NEGATIVE ng/mL
Methadone Screen, Urine: NEGATIVE ng/mL
OXYCODONE+OXYMORPHONE UR QL SCN: NEGATIVE ng/mL
Opiate Scrn, Ur: NEGATIVE ng/mL
PH UR, DRUG SCRN: 7.3 (ref 4.5–8.9)
PHENCYCLIDINE QUANTITATIVE URINE: NEGATIVE ng/mL
PROPOXYPHENE SCREEN URINE: NEGATIVE ng/mL
SPECIFIC GRAVITY: 1.03
Tramadol Screen, Urine: NEGATIVE ng/mL

## 2015-12-22 LAB — CANNABINOID (GC/MS), URINE
CANNABINOID UR: POSITIVE — AB
CARBOXY THC UR: 171 ng/mL

## 2015-12-23 ENCOUNTER — Emergency Department
Admission: EM | Admit: 2015-12-23 | Discharge: 2015-12-23 | Disposition: A | Discharge status: Home / Self Care | Payer: Medicaid Other | Attending: Emergency Medicine | Admitting: Emergency Medicine

## 2015-12-23 ENCOUNTER — Encounter: Payer: Self-pay | Admitting: Emergency Medicine

## 2015-12-23 ENCOUNTER — Emergency Department: Payer: Medicaid Other

## 2015-12-23 DIAGNOSIS — O99321 Drug use complicating pregnancy, first trimester: Secondary | ICD-10-CM | POA: Diagnosis not present

## 2015-12-23 DIAGNOSIS — O200 Threatened abortion: Secondary | ICD-10-CM | POA: Insufficient documentation

## 2015-12-23 DIAGNOSIS — Z87891 Personal history of nicotine dependence: Secondary | ICD-10-CM | POA: Insufficient documentation

## 2015-12-23 DIAGNOSIS — Z3A10 10 weeks gestation of pregnancy: Secondary | ICD-10-CM | POA: Diagnosis not present

## 2015-12-23 DIAGNOSIS — O209 Hemorrhage in early pregnancy, unspecified: Secondary | ICD-10-CM | POA: Diagnosis present

## 2015-12-23 DIAGNOSIS — F1290 Cannabis use, unspecified, uncomplicated: Secondary | ICD-10-CM | POA: Diagnosis not present

## 2015-12-23 LAB — CBC WITH DIFFERENTIAL/PLATELET
BASOS PCT: 1 %
Basophils Absolute: 0 10*3/uL (ref 0–0.1)
EOS ABS: 0.1 10*3/uL (ref 0–0.7)
EOS PCT: 1 %
HCT: 37.5 % (ref 35.0–47.0)
Hemoglobin: 12.9 g/dL (ref 12.0–16.0)
LYMPHS ABS: 1.3 10*3/uL (ref 1.0–3.6)
Lymphocytes Relative: 15 %
MCH: 30.5 pg (ref 26.0–34.0)
MCHC: 34.4 g/dL (ref 32.0–36.0)
MCV: 88.7 fL (ref 80.0–100.0)
MONOS PCT: 7 %
Monocytes Absolute: 0.6 10*3/uL (ref 0.2–0.9)
Neutro Abs: 6.7 10*3/uL — ABNORMAL HIGH (ref 1.4–6.5)
Neutrophils Relative %: 76 %
PLATELETS: 165 10*3/uL (ref 150–440)
RBC: 4.23 MIL/uL (ref 3.80–5.20)
RDW: 12.9 % (ref 11.5–14.5)
WBC: 8.7 10*3/uL (ref 3.6–11.0)

## 2015-12-23 LAB — COMPREHENSIVE METABOLIC PANEL
ALBUMIN: 4.3 g/dL (ref 3.5–5.0)
ALK PHOS: 48 U/L (ref 38–126)
ALT: 15 U/L (ref 14–54)
AST: 18 U/L (ref 15–41)
Anion gap: 8 (ref 5–15)
BILIRUBIN TOTAL: 0.7 mg/dL (ref 0.3–1.2)
BUN: 11 mg/dL (ref 6–20)
CALCIUM: 9.4 mg/dL (ref 8.9–10.3)
CO2: 23 mmol/L (ref 22–32)
Chloride: 103 mmol/L (ref 101–111)
Creatinine, Ser: 0.47 mg/dL (ref 0.44–1.00)
GFR calc Af Amer: >60 mL/min (ref >60)
GLUCOSE: 82 mg/dL (ref 65–99)
Potassium: 3.6 mmol/L (ref 3.5–5.1)
Sodium: 134 mmol/L — ABNORMAL LOW (ref 135–145)
TOTAL PROTEIN: 7.6 g/dL (ref 6.5–8.1)

## 2015-12-23 LAB — URINALYSIS COMPLETE WITH MICROSCOPIC (ARMC ONLY)
BILIRUBIN URINE: NEGATIVE
GLUCOSE, UA: NEGATIVE mg/dL
Hgb urine dipstick: NEGATIVE
Leukocytes, UA: NEGATIVE
Nitrite: NEGATIVE
Protein, ur: NEGATIVE mg/dL
SPECIFIC GRAVITY, URINE: 1.029 (ref 1.005–1.030)
pH: 5 (ref 5.0–8.0)

## 2015-12-23 LAB — ABO/RH: ABO/RH(D): A POS

## 2015-12-23 LAB — HCG, QUANTITATIVE, PREGNANCY: hCG, Beta Chain, Quant, S: 224691 m[IU]/mL — ABNORMAL HIGH (ref <5)

## 2015-12-23 LAB — POCT PREGNANCY, URINE: PREG TEST UR: POSITIVE — AB

## 2015-12-23 NOTE — ED Triage Notes (Signed)
Pt. States she is [redacted] weeks pregnant.  Pt. States sudden onset of bleeding while at work today.  Pt. States this is her 2nd pregnancy.

## 2015-12-23 NOTE — ED Notes (Signed)
Pt. Going home with family and friends.

## 2015-12-23 NOTE — ED Provider Notes (Signed)
[facility] Emergency Department Provider Note        Time seen: ----------------------------------------- 7:23 PM on 12/23/2015 -----------------------------------------    I have reviewed the triage vital signs and the nursing notes.   HISTORY  Chief Complaint Vaginal Bleeding (Pt. states she is [redacted] weeks pregnant.  Pt. states sudden onset of bleeding today.)    HPI Yolanda Pierce is a 25 y.o. female who presents the ER stating she is [redacted] weeks pregnant. Patient states she had sudden onset of brisk bleeding while she was at work today. She states she is G2 P1 AB 0, did not have bleeding with her first pregnancy. She has had cramping for several days but has not had bleeding until today. She denies recent illness or other complaints at this time.   Past Medical History:  Diagnosis Date  . Anemia    On Iron tablets,once daily  . Anxiety   . Depression     Patient Active Problem List   Diagnosis Date Noted  . 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 Nausea and vomiting during pregnancy 11/28/2015  . History of cesarean section 11/28/2015  . Pregnancy 11/28/2015  . Status post cesarean delivery 11/24/2013  . Labor and delivery indication for care or intervention 11/21/2013    Past Surgical History:  Procedure Laterality Date  . CESAREAN SECTION N/A 11/24/2013   Procedure: CESAREAN SECTION;  Surgeon: Essie HartWalda Pinn, MD;  Location: [facility];  Service: Obstetrics;  Laterality: N/A;  . cesarean section  2015  . NO PAST SURGERIES      Allergies Amoxicillin  Social History Social History  Substance Use Topics  . Smoking status: Never Smoker  . Smokeless tobacco: Never Used  . Alcohol use No    Review of Systems Constitutional: Negative for fever. Cardiovascular: Negative for chest pain. Respiratory: Negative for shortness of breath. Gastrointestinal: Positive for abdominal pain Genitourinary: Negative for dysuria. positive for vaginal  bleeding Musculoskeletal: Negative for back pain. Skin: Negative for rash. Neurological: Negative for headaches, focal weakness or numbness.  10-point ROS otherwise negative.  ____________________________________________   PHYSICAL EXAM:  VITAL SIGNS: ED Triage Vitals  Enc Vitals Group     BP      Pulse      Resp      Temp      Temp src      SpO2      Weight      Height      Head Circumference      Peak Flow      Pain Score      Pain Loc      Pain Edu?      Excl. in GC?     Constitutional: Alert and oriented. Anxious, no distress Eyes: Conjunctivae are normal. PERRL. Normal extraocular movements. ENT   Head: Normocephalic and atraumatic.   Nose: No congestion/rhinnorhea.   Mouth/Throat: Mucous membranes are moist.   Neck: No stridor. Cardiovascular: Normal rate, regular rhythm. No murmurs, rubs, or gallops. Respiratory: Normal respiratory effort without tachypnea nor retractions. Breath sounds are clear and equal bilaterally. No wheezes/rales/rhonchi. Gastrointestinal: Soft and nontender. Normal bowel sounds Musculoskeletal: Nontender with normal range of motion in all extremities. No lower extremity tenderness nor edema. Neurologic:  Normal speech and language. No gross focal neurologic deficits are appreciated.  Skin:  Skin is warm, dry and intact. No rash noted. Psychiatric: Mood and affect are normal. Speech and behavior are normal.  ____________________________________________  ED COURSE:  Pertinent  labs & imaging results that were available during my care of the patient were reviewed by me and considered in my medical decision making (see chart for details). Clinical Course  She is in no acute distress, will check basic labs and pregnancy ultrasound  Procedures ____________________________________________   LABS (pertinent positives/negatives)  Labs Reviewed  CBC WITH DIFFERENTIAL/PLATELET - Abnormal; Notable for the following:        Result Value   Neutro Abs 6.7 (*)    All other components within normal limits  COMPREHENSIVE METABOLIC PANEL - Abnormal; Notable for the following:    Sodium 134 (*)    All other components within normal limits  URINALYSIS COMPLETEWITH MICROSCOPIC ([facility] ONLY) - Abnormal; Notable for the following:    Color, Urine YELLOW (*)    APPearance CLOUDY (*)    Ketones, ur TRACE (*)    Bacteria, UA RARE (*)    Squamous Epithelial / LPF 6-30 (*)    All other components within normal limits  HCG, QUANTITATIVE, PREGNANCY - Abnormal; Notable for the following:    hCG, Beta Chain, Quant, S 224,691 (*)    All other components within normal limits  POCT PREGNANCY, URINE - Abnormal; Notable for the following:    Preg Test, Ur POSITIVE (*)    All other components within normal limits  POC URINE PREG, ED  ABO/RH    RADIOLOGY  Pregnancy ultrasound IMPRESSION: Single living IUP measuring 10 weeks 4 days. This is concordant with LMP.  No significant maternal uterine or adnexal abnormality identified.  ____________________________________________  FINAL ASSESSMENT AND PLAN  Threatened miscarriage  Plan: Patient with labs and imaging as dictated above. No clear etiology for her bleeding at this time. I will advise close outpatient follow-up with her OB/GYN doctor.   Emily Filbert, MD   Note: This dictation was prepared with Dragon dictation. Any transcriptional errors that result from this process are unintentional    Emily Filbert, MD 12/23/15 2128

## 2015-12-23 NOTE — ED Notes (Signed)
Patient transported to Ultrasound 

## 2015-12-25 ENCOUNTER — Telehealth: Payer: Self-pay | Admitting: Obstetrics and Gynecology

## 2015-12-25 NOTE — Telephone Encounter (Signed)
Pt added mns schedule Thursday at 8:30am

## 2015-12-25 NOTE — Telephone Encounter (Signed)
This pt went to er bleeding / cramping and she is pregnant. They told her if she continued to cramp to call and make an appt. There is no availability as of right now on dr cherry schedule unless its a workin slot. Can I put her in one of those slots?.she has an appt 8/22 for new ob.

## 2015-12-26 ENCOUNTER — Ambulatory Visit (INDEPENDENT_AMBULATORY_CARE_PROVIDER_SITE_OTHER): Payer: Medicaid Other | Admitting: Obstetrics and Gynecology

## 2015-12-26 DIAGNOSIS — O209 Hemorrhage in early pregnancy, unspecified: Secondary | ICD-10-CM | POA: Diagnosis not present

## 2015-12-26 NOTE — Progress Notes (Signed)
GYNECOLOGY CLINIC PROGRESS NOTE Subjective:    Yolanda Pierce is a 25 y.o. G2P1001 female at 5269w4d gestation by last menstrual period o 10/12/2015 (exact date).   Yolanda JesterMichele is a follow up from Emergency Room visit.  She reports being seen in the Emergency Room 3 days ago for vaginal bleeding of 1 day. Denied cramping or pain.  She is not in acute distress. Ectopic risks: none.  Cycle length: regular q 30 days. Pregnancy testing: at home and quant HCG level 224,691 on 12/23/2015. Pregnancy imaging: transvaginal ultrasound done on 12/23/2015. Result was viable SIUP at 10.4 weeks, normal appearing placenta and ovaries.. Blood type: A positive. Other lab results: hematocrit 37.5 and urinalysis negative except trace ketonuria.  Past Medical History:  Diagnosis Date  . Anemia    On Iron tablets,once daily  . Anxiety   . Depression     Family History  Problem Relation Age of Onset  . Asthma Mother   . Hypertension Mother   . Diabetes Father   . Hypertension Father   . Cancer Neg Hx     Past Surgical History:  Procedure Laterality Date  . CESAREAN SECTION N/A 11/24/2013   Procedure: CESAREAN SECTION;  Surgeon: Essie HartWalda Pinn, MD;  Location: [facility];  Service: Obstetrics;  Laterality: N/A;  . cesarean section  2015  . NO PAST SURGERIES      Social History   Social History  . Marital status: Single    Spouse name: N/A  . Number of children: N/A  . Years of education: N/A   Occupational History  . Not on file.   Social History Main Topics  . Smoking status: Former Games developermoker  . Smokeless tobacco: Never Used  . Alcohol use No  . Drug use:     Types: Marijuana     Comment: used 2 weeks ago  . Sexual activity: Yes    Birth control/ protection: None   Other Topics Concern  . Not on file   Social History Narrative  . No narrative on file   Current Outpatient Prescriptions on File Prior to Visit  Medication Sig Dispense Refill  . doxylamine, Sleep, (UNISOM) 25 MG tablet  Take 0.5 tablets (12.5 mg total) by mouth at bedtime as needed. 30 tablet 0  . Prenatal Vit-Fe Fumarate-FA (PRENATAL MULTIVITAMIN) TABS tablet Take 1 tablet by mouth every morning.     . vitamin B-6 (PYRIDOXINE) 25 MG tablet Take 1 tablet (25 mg total) by mouth 3 (three) times daily. 90 tablet 1   No current facility-administered medications on file prior to visit.     Allergies  Allergen Reactions  . Amoxicillin Rash    Redness and rash all over. Has patient had a PCN reaction causing immediate rash, facial/tongue/throat swelling, SOB or lightheadedness with hypotension: Yes Has patient had a PCN reaction causing severe rash involving mucus membranes or skin necrosis: No Has patient had a PCN reaction that required hospitalization Yes Has patient had a PCN reaction occurring within the last 10 years: No If all of the above answers are "NO", then may proceed with Cephalosporin use.     Review of Systems Pertinent items noted in HPI and remainder of comprehensive ROS otherwise negative.   Objective:     LMP 10/12/2015 (Exact Date)  General:   alert and no distress  Heart: regular rate and rhythm, S1, S2 normal, no murmur, click, rub or gallop  Lungs: clear to auscultation bilaterally  Abdomen: soft, non-tender, without masses or  organomegaly.  FHT 160 bpm on Dopplers  Pelvic: Vulva and vagina appear normal. Bimanual exam reveals normal uterus and adnexa.  No blood in vaginal vault.  Extremities: Non-tender, no cyanosis or edema  Neurologic:  Grossly intct     Assessment:    Bleeding in early pregnancy, has resolved. Viable IUP  Plan:    Follow-up appointment with MD in 1 week for NOB appointment.   Bleeding precautions given.    Hildred LaserAnika Aftan Vint, MD [facility]

## 2015-12-31 DIAGNOSIS — O209 Hemorrhage in early pregnancy, unspecified: Secondary | ICD-10-CM | POA: Insufficient documentation

## 2016-01-02 ENCOUNTER — Encounter: Payer: Medicaid Other | Admitting: Obstetrics and Gynecology

## 2016-01-12 ENCOUNTER — Encounter: Payer: Self-pay | Admitting: Obstetrics and Gynecology

## 2016-01-12 ENCOUNTER — Ambulatory Visit (INDEPENDENT_AMBULATORY_CARE_PROVIDER_SITE_OTHER): Payer: Medicaid Other | Admitting: Obstetrics and Gynecology

## 2016-01-12 VITALS — BP 100/68 | HR 82 | Wt 149.4 lb

## 2016-01-12 DIAGNOSIS — Z3481 Encounter for supervision of other normal pregnancy, first trimester: Secondary | ICD-10-CM | POA: Diagnosis not present

## 2016-01-12 DIAGNOSIS — Z3492 Encounter for supervision of normal pregnancy, unspecified, second trimester: Secondary | ICD-10-CM

## 2016-01-12 LAB — POCT URINALYSIS DIPSTICK
GLUCOSE UA: NEGATIVE
KETONES UA: NEGATIVE
Leukocytes, UA: NEGATIVE
Nitrite, UA: NEGATIVE
RBC UA: NEGATIVE
SPEC GRAV UA: 1.01
Urobilinogen, UA: 0.2
pH, UA: 7

## 2016-01-12 NOTE — Patient Instructions (Signed)

## 2016-01-12 NOTE — Progress Notes (Signed)
NEW OB HISTORY AND PHYSICAL  SUBJECTIVE:       Yolanda Pierce is a 25 y.o. 602P1001 female, Patient's last menstrual period was 10/12/2015 (exact date)., Estimated Date of Delivery: 07/18/16, 628w1d, presents today for establishment of Prenatal Care. She has no unusual complaints and complains of nausea that is resolving      Gynecologic History Patient's last menstrual period was 10/12/2015 (exact date). Normal Contraception: none Last Pap: 2016. Results were: normal  Obstetric History OB History  Gravida Para Term Preterm AB Living  2 1 1  0 0 1  SAB TAB Ectopic Multiple Live Births  0 0 0 0 1    # Outcome Date GA Lbr Len/2nd Weight Sex Delivery Anes PTL Lv  2 Current           1 Term 11/24/13 5121w2d  8 lb 3.9 oz (3.74 kg) F CS-LTranv EPI  LIV      Past Medical History:  Diagnosis Date  . Anemia    On Iron tablets,once daily  . Anxiety   . Depression     Past Surgical History:  Procedure Laterality Date  . CESAREAN SECTION N/A 11/24/2013   Procedure: CESAREAN SECTION;  Surgeon: Essie HartWalda Pinn, MD;  Location: [facility];  Service: Obstetrics;  Laterality: N/A;  . cesarean section  2015  . NO PAST SURGERIES      Current Outpatient Prescriptions on File Prior to Visit  Medication Sig Dispense Refill  . Prenatal Vit-Fe Fumarate-FA (PRENATAL MULTIVITAMIN) TABS tablet Take 1 tablet by mouth every morning.     Marland Kitchen. doxylamine, Sleep, (UNISOM) 25 MG tablet Take 0.5 tablets (12.5 mg total) by mouth at bedtime as needed. (Patient not taking: Reported on 01/12/2016) 30 tablet 0  . vitamin B-6 (PYRIDOXINE) 25 MG tablet Take 1 tablet (25 mg total) by mouth 3 (three) times daily. (Patient not taking: Reported on 01/12/2016) 90 tablet 1   No current facility-administered medications on file prior to visit.     Allergies  Allergen Reactions  . Amoxicillin Rash    Redness and rash all over. Has patient had a PCN reaction causing immediate rash, facial/tongue/throat swelling, SOB or  lightheadedness with hypotension: Yes Has patient had a PCN reaction causing severe rash involving mucus membranes or skin necrosis: No Has patient had a PCN reaction that required hospitalization Yes Has patient had a PCN reaction occurring within the last 10 years: No If all of the above answers are "NO", then may proceed with Cephalosporin use.    Social History   Social History  . Marital status: Single    Spouse name: N/A  . Number of children: N/A  . Years of education: N/A   Occupational History  . Not on file.   Social History Main Topics  . Smoking status: Former Games developermoker  . Smokeless tobacco: Never Used  . Alcohol use No  . Drug use:     Types: Marijuana     Comment: used 2 weeks ago  . Sexual activity: Yes    Birth control/ protection: None   Other Topics Concern  . Not on file   Social History Narrative  . No narrative on file    Family History  Problem Relation Age of Onset  . Asthma Mother   . Hypertension Mother   . Diabetes Father   . Hypertension Father   . Cancer Neg Hx     The following portions of the patient's history were reviewed and updated as appropriate: allergies, current  medications, past OB history, past medical history, past surgical history, past family history, past social history, and problem list.    OBJECTIVE: Initial Physical Exam (New OB)  GENERAL APPEARANCE: alert, well appearing, in no apparent distress HEAD: normocephalic, atraumatic MOUTH: mucous membranes moist, pharynx normal without lesions and dental hygiene good THYROID: no thyromegaly or masses present BREASTS: not examined LUNGS: clear to auscultation, no wheezes, rales or rhonchi, symmetric air entry HEART: regular rate and rhythm, no murmurs ABDOMEN: soft, nontender, nondistended, no abnormal masses, no epigastric pain and FHT present EXTREMITIES: no redness or tenderness in the calves or thighs SKIN: normal coloration and turgor, no rashes LYMPH NODES: no  adenopathy palpable NEUROLOGIC: alert, oriented, normal speech, no focal findings or movement disorder noted  PELVIC EXAM Not examined  ASSESSMENT: Normal pregnancy Previous c/s for FTP 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

## 2016-01-12 NOTE — Progress Notes (Signed)
NOB physical- pt is doing well, having some shoulders, neck tension

## 2016-01-22 ENCOUNTER — Encounter: Payer: Self-pay | Admitting: Obstetrics and Gynecology

## 2016-01-23 ENCOUNTER — Other Ambulatory Visit: Payer: Self-pay | Admitting: Obstetrics and Gynecology

## 2016-02-02 ENCOUNTER — Encounter: Payer: Self-pay | Admitting: Obstetrics and Gynecology

## 2016-02-02 ENCOUNTER — Other Ambulatory Visit: Payer: Self-pay | Admitting: Obstetrics and Gynecology

## 2016-02-09 ENCOUNTER — Ambulatory Visit (INDEPENDENT_AMBULATORY_CARE_PROVIDER_SITE_OTHER): Payer: Medicaid Other | Admitting: Obstetrics and Gynecology

## 2016-02-09 VITALS — BP 108/54 | HR 83 | Wt 155.0 lb

## 2016-02-09 DIAGNOSIS — Z3492 Encounter for supervision of normal pregnancy, unspecified, second trimester: Secondary | ICD-10-CM

## 2016-02-09 LAB — POCT URINALYSIS DIPSTICK
Bilirubin, UA: NEGATIVE
Blood, UA: NEGATIVE
GLUCOSE UA: NEGATIVE
Ketones, UA: NEGATIVE
Leukocytes, UA: NEGATIVE
NITRITE UA: NEGATIVE
PROTEIN UA: NEGATIVE
Spec Grav, UA: 1.015
UROBILINOGEN UA: 0.2
pH, UA: 6.5

## 2016-02-09 NOTE — Progress Notes (Signed)
ROB- pt is doing well, states she is working [employer]

## 2016-02-09 NOTE — Progress Notes (Signed)
ROB- doing well, flu vaccine given, reviewed genetic screening, anatomy scan next visit

## 2016-02-29 ENCOUNTER — Ambulatory Visit (INDEPENDENT_AMBULATORY_CARE_PROVIDER_SITE_OTHER): Payer: Medicaid Other

## 2016-02-29 DIAGNOSIS — Z3492 Encounter for supervision of normal pregnancy, unspecified, second trimester: Secondary | ICD-10-CM

## 2016-03-05 ENCOUNTER — Telehealth: Payer: Self-pay | Admitting: Obstetrics and Gynecology

## 2016-03-05 NOTE — Telephone Encounter (Signed)
Pt called and she is concerned, she is having really bad nose bleeds, she stated she had them with her first pregnancy but not the extinct of how bad these are, she wanted to know if this is normal and what she needs to watch out for.

## 2016-03-06 NOTE — Telephone Encounter (Signed)
Yolanda Pierce has called 3 times and noone has called her back about her nose bleeds.

## 2016-03-06 NOTE — Telephone Encounter (Signed)
Called pt she states that she is having increased nose bleeds. Pt states that she is concerned because it took approx 5 mins to stop bleeding. Advised pt on measures to stop nosebleeds including cool mist humidifier, Vaseline in the nostrils, ice pack on the bridge of nose during bleed, also encouraged pt to take Claritin for seasonal allergies. Pt gave verbal understanding. To call back if no improvements.

## 2016-03-13 ENCOUNTER — Ambulatory Visit (INDEPENDENT_AMBULATORY_CARE_PROVIDER_SITE_OTHER): Payer: Medicaid Other | Admitting: Obstetrics and Gynecology

## 2016-03-13 VITALS — BP 101/66 | HR 78 | Wt 161.6 lb

## 2016-03-13 DIAGNOSIS — K644 Residual hemorrhoidal skin tags: Secondary | ICD-10-CM

## 2016-03-13 DIAGNOSIS — Z3492 Encounter for supervision of normal pregnancy, unspecified, second trimester: Secondary | ICD-10-CM

## 2016-03-13 LAB — POCT URINALYSIS DIPSTICK
Bilirubin, UA: NEGATIVE
Blood, UA: NEGATIVE
GLUCOSE UA: NEGATIVE
Ketones, UA: NEGATIVE
Leukocytes, UA: NEGATIVE
Nitrite, UA: NEGATIVE
Spec Grav, UA: 1.02
UROBILINOGEN UA: 0.2
pH, UA: 6

## 2016-03-13 MED ORDER — HYDROCORTISONE ACE-PRAMOXINE 2.5-1 % RE CREA: 1.0000 "application " | TOPICAL_CREAM | Freq: Three times a day (TID) | RECTAL | 0 refills | Status: DC

## 2016-03-13 NOTE — Progress Notes (Signed)
ROB- " pt feels like she is leaking fluid", thinks she may have a hemorrhoid

## 2016-03-13 NOTE — Progress Notes (Signed)
ROB-spontaneous leaking urine- small amounts of urine, using panty liners, rectal pain and external hemorrhoid- proctofoam rx sent in.

## 2016-04-09 ENCOUNTER — Encounter: Payer: Self-pay | Admitting: Emergency Medicine

## 2016-04-09 ENCOUNTER — Emergency Department
Admission: EM | Admit: 2016-04-09 | Discharge: 2016-04-09 | Disposition: A | Discharge status: Home / Self Care | Payer: Medicaid Other | Attending: Emergency Medicine | Admitting: Emergency Medicine

## 2016-04-09 DIAGNOSIS — Z87891 Personal history of nicotine dependence: Secondary | ICD-10-CM | POA: Diagnosis not present

## 2016-04-09 DIAGNOSIS — Z3490 Encounter for supervision of normal pregnancy, unspecified, unspecified trimester: Secondary | ICD-10-CM

## 2016-04-09 DIAGNOSIS — J069 Acute upper respiratory infection, unspecified: Secondary | ICD-10-CM | POA: Diagnosis not present

## 2016-04-09 DIAGNOSIS — R040 Epistaxis: Secondary | ICD-10-CM | POA: Insufficient documentation

## 2016-04-09 DIAGNOSIS — B9789 Other viral agents as the cause of diseases classified elsewhere: Secondary | ICD-10-CM

## 2016-04-09 DIAGNOSIS — Z3A24 24 weeks gestation of pregnancy: Secondary | ICD-10-CM | POA: Diagnosis not present

## 2016-04-09 DIAGNOSIS — O99512 Diseases of the respiratory system complicating pregnancy, second trimester: Secondary | ICD-10-CM | POA: Diagnosis not present

## 2016-04-09 DIAGNOSIS — O26892 Other specified pregnancy related conditions, second trimester: Secondary | ICD-10-CM | POA: Diagnosis present

## 2016-04-09 MED ORDER — FLUTICASONE PROPIONATE 50 MCG/ACT NA SUSP: 1.0000 | Freq: Two times a day (BID) | NASAL | 0 refills | Status: DC

## 2016-04-09 NOTE — ED Provider Notes (Signed)
[facility] Emergency Department Provider Note  ____________________________________________  Time seen: Approximately 3:47 PM  I have reviewed the triage vital signs and the nursing notes.   HISTORY  Chief Complaint Cough    HPI Yolanda Pierce is a 25 y.o. female who presents to emergency department complaining of nasal congestion, left ear fullness, coughing, and epistaxis. Patient states thatsymptoms have been ongoing times several days. It started off of nasal congestion and coughing. Patient states that she has had some left ear fullness for the last 2 days. Today patient had a nosebleed. She states that she has had these throughout her pregnancy. After nosebleed she had some coughing and then had one episode of posttussive emesis. Patient denies any abdominal pain, vaginal bleeding or discharge. Patient is taking her prenatal vitamins. She has not tried any other medications as "I'm not sure what I can take for the swelling and pregnant."   Past Medical History:  Diagnosis Date  . Anemia    On Iron tablets,once daily  . Anxiety   . Depression     Patient Active Problem List   Diagnosis Date Noted  . 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 Nausea and vomiting during pregnancy 11/28/2015  . History of cesarean section 11/28/2015  . Pregnancy 11/28/2015    Past Surgical History:  Procedure Laterality Date  . CESAREAN SECTION N/A 11/24/2013   Procedure: CESAREAN SECTION;  Surgeon: Essie HartWalda Pinn, MD;  Location: [facility];  Service: Obstetrics;  Laterality: N/A;  . cesarean section  2015  . NO PAST SURGERIES      Prior to Admission medications   Medication Sig Start Date End Date Taking? Authorizing Provider  fluticasone (FLONASE) 50 MCG/ACT nasal spray Place 1 spray into both nostrils 2 (two) times daily. 04/09/16   Delorise RoyalsJonathan D Claude Waldman, PA-C  hydrocortisone-pramoxine (ANALPRAM HC) 2.5-1 % rectal cream Place 1 application rectally 3 (three)  times daily. 03/13/16   Melody Suzan NailerN Shambley, CNM  Prenatal Vit-Fe Fumarate-FA (PRENATAL MULTIVITAMIN) TABS tablet Take 1 tablet by mouth every morning.     Historical Provider, MD    Allergies Amoxicillin  Family History  Problem Relation Age of Onset  . Asthma Mother   . Hypertension Mother   . Diabetes Father   . Hypertension Father   . Cancer Neg Hx     Social History Social History  Substance Use Topics  . Smoking status: Former Games developermoker  . Smokeless tobacco: Never Used  . Alcohol use No     Review of Systems  Constitutional: No fever/chills Eyes: No visual changes. No discharge ENT: Positive for nasal congestion and left ear fullness. Positive for epistaxis earlier. Cardiovascular: no chest pain. Respiratory: Positive cough. No SOB. Gastrointestinal: No abdominal pain.  No nausea, 1 episode of posttussive emesis..  No diarrhea.  No constipation. Genitourinary: Negative for dysuria. No hematuria. No vaginal discharge or bleeding Musculoskeletal: Negative for musculoskeletal pain. Skin: Negative for rash, abrasions, lacerations, ecchymosis. Neurological: Negative for headaches, focal weakness or numbness. 10-point ROS otherwise negative.  ____________________________________________   PHYSICAL EXAM:  VITAL SIGNS: ED Triage Vitals  Enc Vitals Group     BP 04/09/16 1436 106/60     Pulse Rate 04/09/16 1436 92     Resp 04/09/16 1436 18     Temp 04/09/16 1436 98.2 F (36.8 C)     Temp Source 04/09/16 1436 Oral     SpO2 04/09/16 1436 100 %     Weight 04/09/16 1436 167 lb (75.8  kg)     Height --      Head Circumference --      Peak Flow --      Pain Score 04/09/16 1437 0     Pain Loc --      Pain Edu? --      Excl. in GC? --      Constitutional: Alert and oriented. Well appearing and in no acute distress. Eyes: Conjunctivae are normal. PERRL. EOMI. Head: Atraumatic. ENT:      Ears: EACs are unremarkable bilaterally. TM is mildly bulging on left.      Nose:  Moderate congestion/rhinnorhea. Scabbing noted over Kiesselbach plexus right side.      Mouth/Throat: Mucous membranes are moist. Her pharynx is nonerythematous and nonedematous. Uvula is midline. Tonsils are unremarkable bilaterally. Neck: No stridor.  Hematological/Lymphatic/Immunilogical: No cervical lymphadenopathy. Cardiovascular: Normal rate, regular rhythm. Normal S1 and S2.  Good peripheral circulation. Respiratory: Normal respiratory effort without tachypnea or retractions. Lungs CTAB. Good air entry to the bases with no decreased or absent breath sounds. Gastrointestinal: Bowel sounds 4 quadrants. Soft and nontender to palpation. No guarding or rigidity. No palpable masses. No distention. No CVA tenderness. Musculoskeletal: Full range of motion to all extremities. No gross deformities appreciated. Neurologic:  Normal speech and language. No gross focal neurologic deficits are appreciated.  Skin:  Skin is warm, dry and intact. No rash noted. Psychiatric: Mood and affect are normal. Speech and behavior are normal. Patient exhibits appropriate insight and judgement.   ____________________________________________   LABS (all labs ordered are listed, but only abnormal results are displayed)  Labs Reviewed - No data to display ____________________________________________  EKG   ____________________________________________  RADIOLOGY   No results found.  ____________________________________________    PROCEDURES  Procedure(s) performed:    Procedures    Medications - No data to display   ____________________________________________   INITIAL IMPRESSION / ASSESSMENT AND PLAN / ED COURSE  Pertinent labs & imaging results that were available during my care of the patient were reviewed by me and considered in my medical decision making (see chart for details).  Review of the [place] CSRS was performed in accordance of the [facility] prior to dispensing any controlled  drugs.  Clinical Course     Patient's diagnosis is consistent with Viral upper respiratory infection. Patient had one episode of epistaxis today and posttussive emesis. Patient states that she has had episodes of epistaxis throughout her pregnancy. Vital signs were within the normal limits. No concerning findings at this time. Patient's exam is reassuring. Patient is given Flonase for symptom control and advised to take Tylenol. Patient will follow-up with OB/GYN if symptoms change or worsen..  Patient is given ED precautions to return to the ED for any worsening or new symptoms.     ____________________________________________  FINAL CLINICAL IMPRESSION(S) / ED DIAGNOSES  Final diagnoses:  Viral URI with cough  Epistaxis  Pregnancy, unspecified gestational age      NEW MEDICATIONS STARTED DURING THIS VISIT:  Discharge Medication List as of 04/09/2016  3:56 PM    START taking these medications   Details  fluticasone (FLONASE) 50 MCG/ACT nasal spray Place 1 spray into both nostrils 2 (two) times daily., Starting Tue 04/09/2016, Print            This chart was dictated using voice recognition software/Dragon. Despite best efforts to proofread, errors can occur which can change the meaning. Any change was purely unintentional.    Racheal PatchesJonathan D Kathleen Tamm, PA-C 04/09/16 1616  Phineas Semen, MD 04/09/16 [phone]

## 2016-04-09 NOTE — ED Triage Notes (Signed)
Pt to ed with c/o cough, congestion and earaches this am.  Pt denies fever, denies chest pain.  Pt is approx 6 months pregnant.

## 2016-04-09 NOTE — ED Notes (Addendum)
Pt reports that she has congestion/cough for the last 2-3 days - she is also having nose bleeds - pt states that it is hard to breath when she takes in a deep breath - pt is 6 months pregnant

## 2016-04-17 ENCOUNTER — Ambulatory Visit (INDEPENDENT_AMBULATORY_CARE_PROVIDER_SITE_OTHER): Payer: Medicaid Other | Admitting: Obstetrics and Gynecology

## 2016-04-17 VITALS — BP 100/64 | HR 98 | Wt 167.0 lb

## 2016-04-17 DIAGNOSIS — Z3492 Encounter for supervision of normal pregnancy, unspecified, second trimester: Secondary | ICD-10-CM

## 2016-04-17 LAB — POCT URINALYSIS DIPSTICK
Bilirubin, UA: NEGATIVE
GLUCOSE UA: NEGATIVE
KETONES UA: NEGATIVE
Nitrite, UA: NEGATIVE
RBC UA: NEGATIVE
SPEC GRAV UA: 1.015
UROBILINOGEN UA: 0.2
pH, UA: 6.5

## 2016-04-17 NOTE — Progress Notes (Signed)
ROB- pt is having head congestion, otherwise is doing well

## 2016-04-17 NOTE — Progress Notes (Signed)
ROB- Overall doing well but reports persistent head congestions for several weeks. Lung sounds clear bilaterally. List of medications given that are safe in pregnancy and discussed use of humidifier at night. No other complaints. Will follow up in 2 weeks for glucola screening and ROB.

## 2016-04-17 NOTE — Patient Instructions (Signed)
Place 24-38 weeks prenatal visit patient instructions here.

## 2016-05-10 ENCOUNTER — Other Ambulatory Visit: Payer: Medicaid Other

## 2016-05-10 ENCOUNTER — Ambulatory Visit (INDEPENDENT_AMBULATORY_CARE_PROVIDER_SITE_OTHER): Payer: Medicaid Other | Admitting: Obstetrics and Gynecology

## 2016-05-10 VITALS — BP 119/69 | HR 99 | Wt 173.7 lb

## 2016-05-10 DIAGNOSIS — Z3493 Encounter for supervision of normal pregnancy, unspecified, third trimester: Secondary | ICD-10-CM

## 2016-05-10 DIAGNOSIS — M5430 Sciatica, unspecified side: Secondary | ICD-10-CM | POA: Insufficient documentation

## 2016-05-10 LAB — POCT URINALYSIS DIPSTICK
BILIRUBIN UA: NEGATIVE
GLUCOSE UA: NEGATIVE
KETONES UA: NEGATIVE
Leukocytes, UA: NEGATIVE
Nitrite, UA: NEGATIVE
RBC UA: NEGATIVE
SPEC GRAV UA: 1.015
Urobilinogen, UA: 0.2
pH, UA: 6

## 2016-05-10 NOTE — Patient Instructions (Signed)
Sciatica Sciatica is pain, numbness, weakness, or tingling along the path of the sciatic nerve. The sciatic nerve starts in the lower back and runs down the back of each leg. The nerve controls the muscles in the lower leg and in the back of the knee. It also provides feeling (sensation) to the back of the thigh, the lower leg, and the sole of the foot. Sciatica is a symptom of another medical condition that pinches or puts pressure on the sciatic nerve. Generally, sciatica only affects one side of the body. Sciatica usually goes away on its own or with treatment. In some cases, sciatica may keep coming back (recur). What are the causes? This condition is caused by pressure on the sciatic nerve, or pinching of the sciatic nerve. This may be the result of:  A disk in between the bones of the spine (vertebrae) bulging out too far (herniated disk).  Age-related changes in the spinal disks (degenerative disk disease).  A pain disorder that affects a muscle in the buttock (piriformis syndrome).  Extra bone growth (bone spur) near the sciatic nerve.  An injury or break (fracture) of the pelvis.  Pregnancy.  Tumor (rare). What increases the risk? The following factors may make you more likely to develop this condition:  Playing sports that place pressure or stress on the spine, such as football or weight lifting.  Having poor strength and flexibility.  A history of back injury.  A history of back surgery.  Sitting for long periods of time.  Doing activities that involve repetitive bending or lifting.  Obesity. What are the signs or symptoms? Symptoms can vary from mild to very severe, and they may include:  Any of these problems in the lower back, leg, hip, or buttock:  Mild tingling or dull aches.  Burning sensations.  Sharp pains.  Numbness in the back of the calf or the sole of the foot.  Leg weakness.  Severe back pain that makes movement difficult. These symptoms may  get worse when you cough, sneeze, or laugh, or when you sit or stand for long periods of time. Being overweight may also make symptoms worse. In some cases, symptoms may recur over time. How is this diagnosed? This condition may be diagnosed based on:  Your symptoms.  A physical exam. Your health care provider may ask you to do certain movements to check whether those movements trigger your symptoms.  You may have tests, including:  Blood tests.  X-rays.  MRI.  CT scan. How is this treated? In many cases, this condition improves on its own, without any treatment. However, treatment may include:  Reducing or modifying physical activity during periods of pain.  Exercising and stretching to strengthen your abdomen and improve the flexibility of your spine.  Icing and applying heat to the affected area.  Medicines that help:  To relieve pain and swelling.  To relax your muscles.  Injections of medicines that help to relieve pain, irritation, and inflammation around the sciatic nerve (steroids).  Surgery. Follow these instructions at home: Medicines   Take over-the-counter and prescription medicines only as told by your health care provider.  Do not drive or operate heavy machinery while taking prescription pain medicine. Managing pain   If directed, apply ice to the affected area.  Put ice in a plastic bag.  Place a towel between your skin and the bag.  Leave the ice on for 20 minutes, 2-3 times a day.  After icing, apply heat to the   affected area before you exercise or as often as told by your health care provider. Use the heat source that your health care provider recommends, such as a moist heat pack or a heating pad.  Place a towel between your skin and the heat source.  Leave the heat on for 20-30 minutes.  Remove the heat if your skin turns bright red. This is especially important if you are unable to feel pain, heat, or cold. You may have a greater risk of  getting burned. Activity   Return to your normal activities as told by your health care provider. Ask your health care provider what activities are safe for you.  Avoid activities that make your symptoms worse.  Take brief periods of rest throughout the day. Resting in a lying or standing position is usually better than sitting to rest.  When you rest for longer periods, mix in some mild activity or stretching between periods of rest. This will help to prevent stiffness and pain.  Avoid sitting for long periods of time without moving. Get up and move around at least one time each hour.  Exercise and stretch regularly, as told by your health care provider.  Do not lift anything that is heavier than 10 lb (4.5 kg) while you have symptoms of sciatica. When you do not have symptoms, you should still avoid heavy lifting, especially repetitive heavy lifting.  When you lift objects, always use proper lifting technique, which includes:  Bending your knees.  Keeping the load close to your body.  Avoiding twisting. General instructions   Use good posture.  Avoid leaning forward while sitting.  Avoid hunching over while standing.  Maintain a healthy weight. Excess weight puts extra stress on your back and makes it difficult to maintain good posture.  Wear supportive, comfortable shoes. Avoid wearing high heels.  Avoid sleeping on a mattress that is too soft or too hard. A mattress that is firm enough to support your back when you sleep may help to reduce your pain.  Keep all follow-up visits as told by your health care provider. This is important. Contact a health care provider if:  You have pain that wakes you up when you are sleeping.  You have pain that gets worse when you lie down.  Your pain is worse than you have experienced in the past.  Your pain lasts longer than 4 weeks.  You experience unexplained weight loss. Get help right away if:  You lose control of your bowel  or bladder (incontinence).  You have:  Weakness in your lower back, pelvis, buttocks, or legs that gets worse.  Redness or swelling of your back.  A burning sensation when you urinate. This information is not intended to replace advice given to you by your health care provider. Make sure you discuss any questions you have with your health care provider. Document Released: 04/23/2001 Document Revised: 10/03/2015 Document Reviewed: 01/06/2015 Elsevier Interactive Patient Education  2017 [employer]  

## 2016-05-10 NOTE — Progress Notes (Signed)
ROB- discussed sciatic nerve pain

## 2016-05-10 NOTE — Progress Notes (Signed)
[name] is having some SOB, R hip pain

## 2016-05-13 NOTE — L&D Delivery Note (Signed)
[facility]  Labor Events:   Preterm labor:   Rupture date:   Rupture time:   Rupture type: Intact  Fluid Color: Clear  Induction:   Augmentation:   Complications:   Cervical ripening:          Delivery:   Episiotomy:   Lacerations:   Repair suture:   Repair # of packets:   Blood loss (ml): 700   Information for the patient's newborn:  Dorisann FramesHundley, Boy [name] [[id number]][[id number]]    Delivery 07/16/2016 8:07 AM by  C-Section, Low Transverse Sex:  female Gestational Age: 2570w5d Delivery Clinician:   Living?:         APGARS  One minute Five minutes Ten minutes  Skin color:        Heart rate:        Grimace:        Muscle tone:        Breathing:        Totals: 8  9      Presentation/position:      Resuscitation:   Cord information:    Disposition of cord blood:     Blood gases sent?  Complications:   Placenta: Delivered:       appearance Newborn Measurements: Weight: 9 lb 13 oz (4450 g)  Height:    Head circumference:    Chest circumference:    Other providers:    Additional  information: Forceps:   Vacuum:   Breech:   Observed anomalies       Please see Dr. Oretha Milchherry's operative note for details of procedure.    Hildred LaserAnika Joydan Gretzinger, MD [facility]

## 2016-05-14 ENCOUNTER — Other Ambulatory Visit: Payer: Medicaid Other

## 2016-05-14 DIAGNOSIS — Z3493 Encounter for supervision of normal pregnancy, unspecified, third trimester: Secondary | ICD-10-CM

## 2016-05-14 DIAGNOSIS — Z131 Encounter for screening for diabetes mellitus: Secondary | ICD-10-CM

## 2016-05-15 ENCOUNTER — Other Ambulatory Visit: Payer: Self-pay | Admitting: Obstetrics and Gynecology

## 2016-05-15 LAB — HEMOGLOBIN AND HEMATOCRIT, BLOOD
HEMOGLOBIN: 10.3 g/dL — AB (ref 11.1–15.9)
Hematocrit: 30.6 % — ABNORMAL LOW (ref 34.0–46.6)

## 2016-05-15 LAB — GLUCOSE, 1 HOUR GESTATIONAL: Gestational Diabetes Screen: 109 mg/dL (ref 65–139)

## 2016-05-15 MED ORDER — FUSION PLUS PO CAPS: 1.0000 | ORAL_CAPSULE | Freq: Every day | ORAL | 1 refills | Status: DC

## 2016-05-24 ENCOUNTER — Ambulatory Visit (INDEPENDENT_AMBULATORY_CARE_PROVIDER_SITE_OTHER): Payer: Medicaid Other | Admitting: Certified Nurse Midwife

## 2016-05-24 VITALS — BP 115/63 | HR 95 | Wt 175.1 lb

## 2016-05-24 DIAGNOSIS — Z3493 Encounter for supervision of normal pregnancy, unspecified, third trimester: Secondary | ICD-10-CM

## 2016-05-24 LAB — POCT URINALYSIS DIPSTICK
GLUCOSE UA: NEGATIVE
Protein, UA: NEGATIVE
SPEC GRAV UA: 1.02
pH, UA: 5

## 2016-05-24 NOTE — Progress Notes (Signed)
Pt is having some contractions.

## 2016-05-24 NOTE — Patient Instructions (Signed)
Introduction Patient Name: ________________________________________________ Patient Due Date: ____________________ What is a fetal movement count? A fetal movement count is the number of times that you feel your baby move during a certain amount of time. This may also be called a fetal kick count. A fetal movement count is recommended for every pregnant woman. You may be asked to start counting fetal movements as early as week 28 of your pregnancy. Pay attention to when your baby is most active. You may notice your baby's sleep and wake cycles. You may also notice things that make your baby move more. You should do a fetal movement count:  When your baby is normally most active.  At the same time each day. A good time to count movements is while you are resting, after having something to eat and drink. How do I count fetal movements? 1. Find a quiet, comfortable area. Sit, or lie down on your side. 2. Write down the date, the start time and stop time, and the number of movements that you felt between those two times. Take this information with you to your health care visits. 3. For 2 hours, count kicks, flutters, swishes, rolls, and jabs. You should feel at least 10 movements during 2 hours. 4. You may stop counting after you have felt 10 movements. 5. If you do not feel 10 movements in 2 hours, have something to eat and drink. Then, keep resting and counting for 1 hour. If you feel at least 4 movements during that hour, you may stop counting. Contact a health care provider if:  You feel fewer than 4 movements in 2 hours.  Your baby is not moving like he or she usually does. Date: ____________ Start time: ____________ Stop time: ____________ Movements: ____________ Date: ____________ Start time: ____________ Stop time: ____________ Movements: ____________ Date: ____________ Start time: ____________ Stop time: ____________ Movements: ____________ Date: ____________ Start time: ____________  Stop time: ____________ Movements: ____________ Date: ____________ Start time: ____________ Stop time: ____________ Movements: ____________ Date: ____________ Start time: ____________ Stop time: ____________ Movements: ____________ Date: ____________ Start time: ____________ Stop time: ____________ Movements: ____________ Date: ____________ Start time: ____________ Stop time: ____________ Movements: ____________ Date: ____________ Start time: ____________ Stop time: ____________ Movements: ____________ This information is not intended to replace advice given to you by your health care provider. Make sure you discuss any questions you have with your health care provider. Document Released: 05/29/2006 Document Revised: 12/27/2015 Document Reviewed: 06/08/2015 Elsevier Interactive Patient Education  2017 [employer] Inc.  

## 2016-05-24 NOTE — Progress Notes (Signed)
[name] doing well. Discussed round ligament pain and back pain. Reviewed home measures. RTC x 2 wks with Dr Valentino Saxonherry to discuss rpt c/s, then back to midwife for next appointment.

## 2016-06-09 ENCOUNTER — Emergency Department
Admission: EM | Admit: 2016-06-09 | Discharge: 2016-06-09 | Disposition: A | Discharge status: Home / Self Care | Payer: Medicaid Other | Attending: Emergency Medicine | Admitting: Emergency Medicine

## 2016-06-09 ENCOUNTER — Emergency Department: Payer: Medicaid Other

## 2016-06-09 ENCOUNTER — Encounter: Payer: Self-pay | Admitting: Emergency Medicine

## 2016-06-09 DIAGNOSIS — Z79899 Other long term (current) drug therapy: Secondary | ICD-10-CM | POA: Insufficient documentation

## 2016-06-09 DIAGNOSIS — O26893 Other specified pregnancy related conditions, third trimester: Secondary | ICD-10-CM | POA: Insufficient documentation

## 2016-06-09 DIAGNOSIS — R079 Chest pain, unspecified: Secondary | ICD-10-CM | POA: Diagnosis not present

## 2016-06-09 DIAGNOSIS — Z87891 Personal history of nicotine dependence: Secondary | ICD-10-CM | POA: Insufficient documentation

## 2016-06-09 DIAGNOSIS — R0602 Shortness of breath: Secondary | ICD-10-CM | POA: Diagnosis not present

## 2016-06-09 DIAGNOSIS — O99323 Drug use complicating pregnancy, third trimester: Secondary | ICD-10-CM | POA: Diagnosis not present

## 2016-06-09 DIAGNOSIS — Z3A34 34 weeks gestation of pregnancy: Secondary | ICD-10-CM | POA: Insufficient documentation

## 2016-06-09 DIAGNOSIS — F1290 Cannabis use, unspecified, uncomplicated: Secondary | ICD-10-CM | POA: Insufficient documentation

## 2016-06-09 LAB — CBC
HCT: 29.1 % — ABNORMAL LOW (ref 35.0–47.0)
Hemoglobin: 9.9 g/dL — ABNORMAL LOW (ref 12.0–16.0)
MCH: 27.4 pg (ref 26.0–34.0)
MCHC: 33.9 g/dL (ref 32.0–36.0)
MCV: 81 fL (ref 80.0–100.0)
PLATELETS: 181 10*3/uL (ref 150–440)
RBC: 3.6 MIL/uL — ABNORMAL LOW (ref 3.80–5.20)
RDW: 14.3 % (ref 11.5–14.5)
WBC: 14.1 10*3/uL — ABNORMAL HIGH (ref 3.6–11.0)

## 2016-06-09 LAB — BASIC METABOLIC PANEL
Anion gap: 6 (ref 5–15)
BUN: 7 mg/dL (ref 6–20)
CALCIUM: 8.3 mg/dL — AB (ref 8.9–10.3)
CO2: 21 mmol/L — ABNORMAL LOW (ref 22–32)
CREATININE: 0.54 mg/dL (ref 0.44–1.00)
Chloride: 109 mmol/L (ref 101–111)
GFR calc Af Amer: >60 mL/min (ref >60)
GLUCOSE: 125 mg/dL — AB (ref 65–99)
Potassium: 3.3 mmol/L — ABNORMAL LOW (ref 3.5–5.1)
SODIUM: 136 mmol/L (ref 135–145)

## 2016-06-09 LAB — TROPONIN I: Troponin I: <0.03 ng/mL (ref <0.03)

## 2016-06-09 MED ORDER — IOPAMIDOL (ISOVUE-370) INJECTION 76%
75.0000 mL | Freq: Once | INTRAVENOUS | Status: AC | PRN
  Administered 2016-06-09: 75 mL via INTRAVENOUS

## 2016-06-09 NOTE — ED Triage Notes (Signed)
Pt from work with sob, blurred vision, chest pressure. Pt states this started this morning; she felt normal yesterday. Pt alert & oriented with NAD noted.

## 2016-06-09 NOTE — ED Provider Notes (Signed)
[facility] Emergency Department Provider Note  Time seen: 2:15 PM  I have reviewed the triage vital signs and the nursing notes.   HISTORY  Chief Complaint Shortness of Breath and Blurred Vision    HPI Yolanda Pierce is a 26 y.o. female with a past medical history of anemia, depression, partially [redacted] weeks pregnant who presents the emergency department for shortness of breath and chest pain. According to the patient approximately 1 hour ago she developed acute onset of shortness of breath which she describes as pain to the center of her chest and inability to take a deep breath. This is the patient's second pregnancy, denies any shortness of breath with her first pregnancy. Denies any abdominal pain. Denies any cough congestion or fever. Denies any leg pain or swelling. No history of blood clots in the past.  Past Medical History:  Diagnosis Date  . Anemia    On Iron tablets,once daily  . Anxiety   . Depression     Patient Active Problem List   Diagnosis Date Noted  . Sciatic leg pain 05/10/2016  . 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 Nausea and vomiting during pregnancy 11/28/2015  . History of cesarean section 11/28/2015  . Pregnancy 11/28/2015    Past Surgical History:  Procedure Laterality Date  . CESAREAN SECTION N/A 11/24/2013   Procedure: CESAREAN SECTION;  Surgeon: Essie HartWalda Pinn, MD;  Location: [facility];  Service: Obstetrics;  Laterality: N/A;  . cesarean section  2015  . NO PAST SURGERIES      Prior to Admission medications   Medication Sig Start Date End Date Taking? Authorizing Provider  Prenatal Vit-Fe Fumarate-FA (PRENATAL MULTIVITAMIN) TABS tablet Take 1 tablet by mouth every morning.     Historical Provider, MD    Allergies  Allergen Reactions  . Amoxicillin Rash    Redness and rash all over. Has patient had a PCN reaction causing immediate rash, facial/tongue/throat swelling, SOB or lightheadedness with hypotension:  Yes Has patient had a PCN reaction causing severe rash involving mucus membranes or skin necrosis: No Has patient had a PCN reaction that required hospitalization Yes Has patient had a PCN reaction occurring within the last 10 years: No If all of the above answers are "NO", then may proceed with Cephalosporin use.    Family History  Problem Relation Age of Onset  . Asthma Mother   . Hypertension Mother   . Diabetes Father   . Hypertension Father   . Cancer Neg Hx     Social History Social History  Substance Use Topics  . Smoking status: Former Games developermoker  . Smokeless tobacco: Never Used  . Alcohol use No    Review of Systems Constitutional: Negative for fever. Cardiovascular: Positive for chest pain. Respiratory: Positive for shortness of breath. Gastrointestinal: Negative for abdominal pain Neurological: Negative for headache 10-point ROS otherwise negative.  ____________________________________________   PHYSICAL EXAM:  VITAL SIGNS: ED Triage Vitals  Enc Vitals Group     BP 06/09/16 1342 113/62     Pulse Rate 06/09/16 1342 (!) 136     Resp 06/09/16 1342 20     Temp 06/09/16 1342 98.7 F (37.1 C)     Temp Source 06/09/16 1342 Oral     SpO2 06/09/16 1342 98 %     Weight 06/09/16 1343 178 lb (80.7 kg)     Height 06/09/16 1343 5\' 1"  (1.549 m)     Head Circumference --      Peak  Flow --      Pain Score 06/09/16 1343 7     Pain Loc --      Pain Edu? --      Excl. in GC? --     Constitutional: Alert and oriented. Well appearing and in no distress. Eyes: Normal exam ENT   Head: Normocephalic and atraumatic.   Mouth/Throat: Mucous membranes are moist. Cardiovascular: Regular rhythm, rate around 110 bpm. No murmur. Respiratory: Normal respiratory effort without tachypnea nor retractions. Breath sounds are clear Gastrointestinal: Gravid abdomen, nontender. Musculoskeletal: Nontender with normal range of motion in all extremities. No lower extremity tenderness  or edema. Neurologic:  Normal speech and language. No gross focal neurologic deficits Skin:  Skin is warm, dry and intact.  Psychiatric: Mood and affect are normal.  ____________________________________________    EKG  EKG reviewed and interpreted by myself shows sinus tachycardia at 111 bpm, narrow QRS, normal axis, normal intervals, no ST changes.  ____________________________________________    RADIOLOGY  CT pending  ____________________________________________   INITIAL IMPRESSION / ASSESSMENT AND PLAN / ED COURSE  Pertinent labs & imaging results that were available during my care of the patient were reviewed by me and considered in my medical decision making (see chart for details).  Patient is [redacted] weeks pregnant presents with sudden onset of chest discomfort she describes as central chest pain and shortness of breath feeling like she cannot take a full breath. No pleuritic component to the chest pain. No leg pain or swelling. Patient is mildly tachycardic around 110 eats per minute. 130 bpm upon arrival. Given the patient's sustained tachycardia with sudden onset of shortness breath and chest discomfort we will check labs as well as obtain a CT angiography of the chest to rule out pulmonary embolism. I discussed the pros and cons of CT scan of the chest, patient is agreeable to proceed with abdominal shielding.  Labs show a slight leukocytosis which is fairly expected during pregnancy, otherwise largely within normal limits. Creatinine is normal. CT angiography pending. Patient care signed out to Dr. Huel Cote. Patient continues to appear very well. I anticipate likely discharge home at this CT angiography is negative.  ____________________________________________   FINAL CLINICAL IMPRESSION(S) / ED DIAGNOSES  Shortness breath chest pain    Minna Antis, MD 06/09/16 1516

## 2016-06-09 NOTE — ED Notes (Signed)
FIRST NURSE NOTE: Pt is [redacted] weeks pregnant, sudden onset of shortness of breath and chest pain, pt also reports pressure in stomach as well. [facility] wants patient evaluated in ED first. Pt sees Dr. Valentino Saxonherry last OV was 2-3 weeks ago. No pregnancy complications.

## 2016-06-09 NOTE — Discharge Instructions (Signed)
Please return immediately if condition worsens. Please contact her primary physician or the physician you were given for referral. If you have any specialist physicians involved in her treatment and plan please also contact them. Thank you for using [place] [facility]. ° °

## 2016-06-09 NOTE — ED Provider Notes (Signed)
-----------------------------------------   3:41 PM on 06/09/2016 -----------------------------------------   Blood pressure 113/62, pulse (!) 136, temperature 98.7 F (37.1 C), temperature source Oral, resp. rate 20, height 5\' 1"  (1.549 m), weight 178 lb (80.7 kg), last menstrual period 10/12/2015, SpO2 98 %.  Assuming care from Dr. Lenard LancePaduchowski.  In short, Yolanda Pierce is a 26 y.o. female with a chief complaint of Shortness of Breath and Blurred Vision .  Refer to the original H&P for additional details.  The current plan of care is to *following the results of the CAT scan of the chest  "Ct Angio Chest Pe W And/or Wo Contrast  Result Date: 06/09/2016 CLINICAL DATA:  Shortness of breath and dizziness. Thirty-four weeks pregnant. EXAM: CT ANGIOGRAPHY CHEST WITH CONTRAST TECHNIQUE: Multidetector CT imaging of the chest was performed using the standard protocol during bolus administration of intravenous contrast. Multiplanar CT image reconstructions and MIPs were obtained to evaluate the vascular anatomy. CONTRAST:  75 cc Isovue 370 intravenously. COMPARISON:  None. FINDINGS: Cardiovascular: Satisfactory opacification of the pulmonary arteries to the segmental level. No evidence of pulmonary embolism. Normal heart size. No pericardial effusion. Mediastinum/Nodes: No enlarged mediastinal, hilar, or axillary lymph nodes. Thyroid gland, trachea, and esophagus demonstrate no significant findings. Lungs/Pleura: Lungs are clear. No pleural effusion or pneumothorax. Upper Abdomen: No acute abnormality. Musculoskeletal: No chest wall abnormality. No acute or significant osseous findings. Review of the MIP images confirms the above findings. IMPRESSION: No evidence of pulmonary embolus or other acute cardiovascular pathology by CT. Electronically Signed   By: Ted Mcalpineobrinka  Dimitrova M.D.   On: 06/09/2016 15:34  " Patient will be discharged accordingly. She states she feels fine at present and requests a  work note for today. She has a follow-up appointment already scheduled early this week with her OB/GYN for normal prenatal evaluation  Patient was advised to return immediately if condition worsens. Patient was advised to follow up with their primary care physician or other specialized physicians involved in their outpatient care. The patient and/or family member/power of attorney had laboratory results reviewed at the bedside. All questions and concerns were addressed and appropriate discharge instructions were distributed by the nursing staff.         Jennye MoccasinBrian S Caron Tardif, MD 06/09/16 [phone]

## 2016-06-12 ENCOUNTER — Ambulatory Visit (INDEPENDENT_AMBULATORY_CARE_PROVIDER_SITE_OTHER): Payer: Medicaid Other | Admitting: Obstetrics and Gynecology

## 2016-06-12 VITALS — BP 106/63 | HR 108 | Wt 181.0 lb

## 2016-06-12 DIAGNOSIS — Z3483 Encounter for supervision of other normal pregnancy, third trimester: Secondary | ICD-10-CM

## 2016-06-12 DIAGNOSIS — Z98891 History of uterine scar from previous surgery: Secondary | ICD-10-CM

## 2016-06-12 LAB — POCT URINALYSIS DIPSTICK
Bilirubin, UA: NEGATIVE
Glucose, UA: NEGATIVE
KETONES UA: NEGATIVE
Nitrite, UA: NEGATIVE
RBC UA: NEGATIVE
SPEC GRAV UA: 1.025
Urobilinogen, UA: NEGATIVE
pH, UA: 6.5

## 2016-06-12 NOTE — Progress Notes (Signed)
ROB

## 2016-06-12 NOTE — Progress Notes (Signed)
ROB: Patient denies complaints  today. For consultation and scheduling of repeat C-section (prior x 1, declines TOLAC). Discussed risks/benefits of repeat C-section, as well as procedure and recovery. Will schedule 07/16/2016.  Discussed SOB (seen in ER several days ago with negative workup, including dopplers and CT chest), may be secondary to fetal positioning.  RTC in 1 week with midwifery service. To f/u with MD at 38 weeks for pre-op.

## 2016-06-26 ENCOUNTER — Ambulatory Visit (INDEPENDENT_AMBULATORY_CARE_PROVIDER_SITE_OTHER): Payer: Medicaid Other | Admitting: Obstetrics and Gynecology

## 2016-06-26 VITALS — BP 112/68 | HR 85 | Wt 181.5 lb

## 2016-06-26 DIAGNOSIS — Z3493 Encounter for supervision of normal pregnancy, unspecified, third trimester: Secondary | ICD-10-CM

## 2016-06-26 DIAGNOSIS — Z3685 Encounter for antenatal screening for Streptococcus B: Secondary | ICD-10-CM

## 2016-06-26 DIAGNOSIS — Z113 Encounter for screening for infections with a predominantly sexual mode of transmission: Secondary | ICD-10-CM

## 2016-06-26 LAB — POCT URINALYSIS DIPSTICK
BILIRUBIN UA: NEGATIVE
GLUCOSE UA: NEGATIVE
Ketones, UA: NEGATIVE
Leukocytes, UA: NEGATIVE
NITRITE UA: NEGATIVE
RBC UA: NEGATIVE
SPEC GRAV UA: 1.015
Urobilinogen, UA: 0.2
pH, UA: 6.5

## 2016-06-26 NOTE — Progress Notes (Signed)
ROB- cultures obtained, pt is having some pelvic pressure 

## 2016-06-26 NOTE — Progress Notes (Signed)
ROB, occasional dizzy spells. Discussed po hydration and avoidance of standing/sitting for long periods of time.

## 2016-06-28 LAB — GC/CHLAMYDIA PROBE AMP
CHLAMYDIA, DNA PROBE: NEGATIVE
Neisseria gonorrhoeae by PCR: NEGATIVE

## 2016-06-28 LAB — STREP GP B NAA+RFLX: STREP GP B NAA+RFLX: NEGATIVE

## 2016-07-02 ENCOUNTER — Telehealth: Payer: Self-pay

## 2016-07-02 ENCOUNTER — Observation Stay
Admission: EM | Admit: 2016-07-02 | Discharge: 2016-07-02 | Disposition: A | Discharge status: Home / Self Care | Payer: Medicaid Other | Attending: Obstetrics and Gynecology | Admitting: Obstetrics and Gynecology

## 2016-07-02 ENCOUNTER — Encounter: Payer: Self-pay | Admitting: *Deleted

## 2016-07-02 DIAGNOSIS — R109 Unspecified abdominal pain: Secondary | ICD-10-CM | POA: Insufficient documentation

## 2016-07-02 DIAGNOSIS — M549 Dorsalgia, unspecified: Secondary | ICD-10-CM | POA: Diagnosis not present

## 2016-07-02 DIAGNOSIS — Z3403 Encounter for supervision of normal first pregnancy, third trimester: Secondary | ICD-10-CM | POA: Diagnosis not present

## 2016-07-02 DIAGNOSIS — Z3490 Encounter for supervision of normal pregnancy, unspecified, unspecified trimester: Secondary | ICD-10-CM

## 2016-07-02 LAB — CBC
HEMATOCRIT: 31.5 % — AB (ref 35.0–47.0)
HEMOGLOBIN: 10.5 g/dL — AB (ref 12.0–16.0)
MCH: 25.6 pg — ABNORMAL LOW (ref 26.0–34.0)
MCHC: 33.2 g/dL (ref 32.0–36.0)
MCV: 77.2 fL — AB (ref 80.0–100.0)
Platelets: 194 10*3/uL (ref 150–440)
RBC: 4.08 MIL/uL (ref 3.80–5.20)
RDW: 15.7 % — AB (ref 11.5–14.5)
WBC: 11.9 10*3/uL — AB (ref 3.6–11.0)

## 2016-07-02 LAB — COMPREHENSIVE METABOLIC PANEL
ALBUMIN: 3 g/dL — AB (ref 3.5–5.0)
ALK PHOS: 173 U/L — AB (ref 38–126)
ALT: 12 U/L — ABNORMAL LOW (ref 14–54)
AST: 22 U/L (ref 15–41)
Anion gap: 6 (ref 5–15)
BILIRUBIN TOTAL: 0.5 mg/dL (ref 0.3–1.2)
BUN: 6 mg/dL (ref 6–20)
CO2: 21 mmol/L — ABNORMAL LOW (ref 22–32)
Calcium: 8.4 mg/dL — ABNORMAL LOW (ref 8.9–10.3)
Chloride: 110 mmol/L (ref 101–111)
Creatinine, Ser: 0.56 mg/dL (ref 0.44–1.00)
GFR calc Af Amer: >60 mL/min (ref >60)
GFR calc non Af Amer: >60 mL/min (ref >60)
GLUCOSE: 73 mg/dL (ref 65–99)
Potassium: 3.6 mmol/L (ref 3.5–5.1)
Sodium: 137 mmol/L (ref 135–145)
TOTAL PROTEIN: 6.7 g/dL (ref 6.5–8.1)

## 2016-07-02 LAB — URINALYSIS, ROUTINE W REFLEX MICROSCOPIC
Bacteria, UA: NONE SEEN
Bilirubin Urine: NEGATIVE
Glucose, UA: NEGATIVE mg/dL
Hgb urine dipstick: NEGATIVE
KETONES UR: NEGATIVE mg/dL
Nitrite: NEGATIVE
PH: 5 (ref 5.0–8.0)
Protein, ur: 100 mg/dL — AB
SPECIFIC GRAVITY, URINE: 1.024 (ref 1.005–1.030)

## 2016-07-02 LAB — PROTEIN / CREATININE RATIO, URINE
Creatinine, Urine: 40 mg/dL
Total Protein, Urine: <6 mg/dL

## 2016-07-02 MED ORDER — HYDRALAZINE HCL 20 MG/ML IJ SOLN: 10.0000 mg | Freq: Once | INTRAMUSCULAR | Status: DC | PRN

## 2016-07-02 MED ORDER — LABETALOL HCL 5 MG/ML IV SOLN: 20.0000 mg | INTRAVENOUS | Status: DC | PRN

## 2016-07-02 NOTE — Discharge Summary (Signed)
Pt d/c'd home with family in stable condition. Does not c/o pain upon d/c. Next appointment scheduled. Pt given Labor precautions and fetal kick count instructions. Verbalized understanding.

## 2016-07-02 NOTE — Discharge Instructions (Signed)
Braxton Hicks Contractions °Contractions of the uterus can occur throughout pregnancy. Contractions are not always a sign that you are in labor.  °WHAT ARE BRAXTON HICKS CONTRACTIONS?  °Contractions that occur before labor are called Braxton Hicks contractions, or false labor. Toward the end of pregnancy (32-34 weeks), these contractions can develop more often and may become more forceful. This is not true labor because these contractions do not result in opening (dilatation) and thinning of the cervix. They are sometimes difficult to tell apart from true labor because these contractions can be forceful and people have different pain tolerances. You should not feel embarrassed if you go to the hospital with false labor. Sometimes, the only way to tell if you are in true labor is for your health care provider to look for changes in the cervix. °If there are no prenatal problems or other health problems associated with the pregnancy, it is completely safe to be sent home with false labor and await the onset of true labor. °HOW CAN YOU TELL THE DIFFERENCE BETWEEN TRUE AND FALSE LABOR? °False Labor  °· The contractions of false labor are usually shorter and not as hard as those of true labor.   °· The contractions are usually irregular.   °· The contractions are often felt in the front of the lower abdomen and in the groin.   °· The contractions may go away when you walk around or change positions while lying down.   °· The contractions get weaker and are shorter lasting as time goes on.   °· The contractions do not usually become progressively stronger, regular, and closer together as with true labor.   °True Labor  °· Contractions in true labor last 30-70 seconds, become very regular, usually become more intense, and increase in frequency.   °· The contractions do not go away with walking.   °· The discomfort is usually felt in the top of the uterus and spreads to the lower abdomen and low back.   °· True labor can be  determined by your health care provider with an exam. This will show that the cervix is dilating and getting thinner.   °WHAT TO REMEMBER °· Keep up with your usual exercises and follow other instructions given by your health care provider.   °· Take medicines as directed by your health care provider.   °· Keep your regular prenatal appointments.   °· Eat and drink lightly if you think you are going into labor.   °· If Braxton Hicks contractions are making you uncomfortable:   °¨ Change your position from lying down or resting to walking, or from walking to resting.   °¨ Sit and rest in a tub of warm water.   °¨ Drink 2-3 glasses of water. Dehydration may cause these contractions.   °¨ Do slow and deep breathing several times an hour.   °WHEN SHOULD I SEEK IMMEDIATE MEDICAL CARE? °Seek immediate medical care if: °· Your contractions become stronger, more regular, and closer together.   °· You have fluid leaking or gushing from your vagina.   °· You have a fever.   °· You pass blood-tinged mucus.   °· You have vaginal bleeding.   °· You have continuous abdominal pain.   °· You have low back pain that you never had before.   °· You feel your baby's head pushing down and causing pelvic pressure.   °· Your baby is not moving as much as it used to.   °This information is not intended to replace advice given to you by your health care provider. Make sure you discuss any questions you have with your health care   provider. °Document Released: 04/29/2005 Document Revised: 08/21/2015 Document Reviewed: 02/08/2013 °Elsevier Interactive Patient Education © 2017 [employer] ° °

## 2016-07-02 NOTE — OB Triage Note (Signed)
G2P1 presents at 5851w5d w c/o abdominal pain, back pain, and LOF. No VB. Feels baby is moving more than normal.

## 2016-07-02 NOTE — Telephone Encounter (Signed)
Pt had called earlier and spoke to front desk, could not locate message. Pt states since 8pm last night she has been in lots of pain in her entire back and lower stomach. Took Tylenol and this did not help, having nausea, periods of crying she was in so much pain. Napped a little and woke up with the pain. No vaginal bleeding, unsure if leaking fluid. Having C/S 07/16/16 and scheduled for pre-op tomorrow. Dr. Valentino Saxonherry referred pt to L&D.  L&D notified.

## 2016-07-02 NOTE — Final Progress Note (Signed)
L&D OB Triage Note  Subjective:  [facility] Yolanda Pierce is a 26 y.o. G2P1001 female at [redacted]w[redacted]d, EDD Estimated Date of Delivery: 07/18/16 who presented to triage for complaints of intermittent abdominal and back pain (pain: 5-6/10) since 2000 on 07/01/2016. She took 1300 mg of Tylenol at 0600 with moderate relief, but has not attempted any other home measures. She was evaluated by the nurses with no significant findings/findings significant for labor or preeclampsia. Vital signs stable. An NST was performed and has been reviewed by CNM. She was treated with warm heat to her lower back and reported relief. Denies difficulty breathing or respiratory distress, chest pain, vaginal bleeding, and leg pain or swelling. She is sitting quietly in bed watching television with family members at bedside.   Objective:  BP 110/67   Pulse 89   Temp 98 F (36.7 C) (Oral)   Resp 18   Ht 5\' 2"  (1.575 m)   Wt 181 lb (82.1 kg)   LMP 10/12/2015 (Exact Date)   BMI 33.11 kg/m   Pt is alert and interactive. Lungs clear to ausculation bilateral. Regular heart rate and rhythm noted. Abdomen soft, gravid, non-tender. Reflexes WNL. No edema. Negative Homans sign. Pedal pulses palpable and equal bilaterally.  Urinalysis    Component Value Date/Time   COLORURINE AMBER (A) 07/02/2016 1249   APPEARANCEUR TURBID (A) 07/02/2016 1249   APPEARANCEUR Cloudy (A) 12/18/2015 1602   LABSPEC 1.024 07/02/2016 1249   LABSPEC 1.017 03/05/2013 0820   PHURINE 5.0 07/02/2016 1249   GLUCOSEU NEGATIVE 07/02/2016 1249   GLUCOSEU Negative 03/05/2013 0820   HGBUR NEGATIVE 07/02/2016 1249   BILIRUBINUR NEGATIVE 07/02/2016 1249   BILIRUBINUR neg 06/26/2016 1343   BILIRUBINUR Negative 12/18/2015 1602   BILIRUBINUR Negative 03/05/2013 0820   KETONESUR NEGATIVE 07/02/2016 1249   PROTEINUR 100 (A) 07/02/2016 1249   UROBILINOGEN 0.2 06/26/2016 1343   UROBILINOGEN 0.2 11/12/2013 1952   NITRITE NEGATIVE 07/02/2016 1249   LEUKOCYTESUR  MODERATE (A) 07/02/2016 1249   LEUKOCYTESUR Negative 12/18/2015 1602   LEUKOCYTESUR Trace 03/05/2013 0820   CBC    Component Value Date/Time   WBC 11.9 (H) 07/02/2016 1422   RBC 4.08 07/02/2016 1422   HGB 10.5 (L) 07/02/2016 1422   HGB 13.6 05/10/2014 0311   HCT 31.5 (L) 07/02/2016 1422   HCT 30.6 (L) 05/14/2016 0834   PLT 194 07/02/2016 1422   PLT 190 12/18/2015 1602   MCV 77.2 (L) 07/02/2016 1422   MCV 90 12/18/2015 1602   MCV 81 05/10/2014 0311   MCH 25.6 (L) 07/02/2016 1422   MCHC 33.2 07/02/2016 1422   RDW 15.7 (H) 07/02/2016 1422   RDW 13.1 12/18/2015 1602   RDW 16.9 (H) 05/10/2014 0311   LYMPHSABS 1.3 12/23/2015 1928   LYMPHSABS 1.2 12/18/2015 1602   LYMPHSABS 0.3 (L) 05/10/2014 0311   MONOABS 0.6 12/23/2015 1928   MONOABS 0.6 05/10/2014 0311   EOSABS 0.1 12/23/2015 1928   EOSABS 0.1 12/18/2015 1602   EOSABS 0.0 05/10/2014 0311   BASOSABS 0.0 12/23/2015 1928   BASOSABS 0.0 12/18/2015 1602   BASOSABS 0.0 05/10/2014 0311   CMP     Component Value Date/Time   NA 137 07/02/2016 1421   NA 147 (H) 05/10/2014 0311   K 3.6 07/02/2016 1421   K 3.5 05/10/2014 0311   CL 110 07/02/2016 1421   CL 107 05/10/2014 0311   CO2 21 (L) 07/02/2016 1421   CO2 22 05/10/2014 0311   GLUCOSE 73 07/02/2016 1421  GLUCOSE 146 (H) 05/10/2014 0311   BUN 6 07/02/2016 1421   BUN 13 05/10/2014 0311   CREATININE 0.56 07/02/2016 1421   CREATININE 0.95 05/10/2014 0311   CALCIUM 8.4 (L) 07/02/2016 1421   CALCIUM 9.1 05/10/2014 0311   PROT 6.7 07/02/2016 1421   PROT 8.1 05/10/2014 0311   ALBUMIN 3.0 (L) 07/02/2016 1421   ALBUMIN 4.0 05/10/2014 0311   AST 22 07/02/2016 1421   AST 26 05/10/2014 0311   ALT 12 (L) 07/02/2016 1421   ALT 27 05/10/2014 0311   ALKPHOS 173 (H) 07/02/2016 1421   ALKPHOS 77 05/10/2014 0311   BILITOT 0.5 07/02/2016 1421   BILITOT 0.7 05/10/2014 0311   GFRNONAA >60 07/02/2016 1421   GFRNONAA >60 05/10/2014 0311   GFRNONAA >60 03/05/2013 0759   GFRAA >60  07/02/2016 1421   GFRAA >60 05/10/2014 0311   GFRAA >60 03/05/2013 0759   Lab Results  Component Value Date   CREATININE 0.56 07/02/2016   CREATININE 0.54 06/09/2016   CREATININE 0.47 12/23/2015   FHR  Mode: External Baseline Rate (A): 135 bpm Variability: Moderate Accelerations: 15 x 15 Decelerations: None   Contraction Frequency (min): 2-4, mild, soft resting tone  SVE: closed/thick/high x 2 exams  Assessment:  G2P1 37 weeks 5 days, previous c/s-desires repeat, Rh+, GBS negative, Braxton Hicks Contractions, Back pain in pregnancy, NOT IN LABOR  Plan:  NST performed was reviewed and was found to be reactive. She was discharged home with bleeding/labor precautions and kick counts.  Continue routine prenatal care. Follow up with OB/GYN as previously scheduled. Pt may take Tylenol PM this evening for any discomforts associated with pregnancy. Encouraged adequate hydration and use of abdominal support in pregnancy.    Gunnar BullaJenkins Michelle Lawhorn, CNM  I have reviewed the record and concur with patient management and plan. Ervan Heber, Daphine DeutscherMARTIN, MD, Evern CoreFACOG

## 2016-07-03 ENCOUNTER — Ambulatory Visit (INDEPENDENT_AMBULATORY_CARE_PROVIDER_SITE_OTHER): Payer: Medicaid Other | Admitting: Obstetrics and Gynecology

## 2016-07-03 ENCOUNTER — Encounter: Payer: Medicaid Other | Admitting: Obstetrics and Gynecology

## 2016-07-03 VITALS — BP 107/69 | HR 100 | Wt 183.1 lb

## 2016-07-03 DIAGNOSIS — Z3483 Encounter for supervision of other normal pregnancy, third trimester: Secondary | ICD-10-CM

## 2016-07-03 DIAGNOSIS — Z98891 History of uterine scar from previous surgery: Secondary | ICD-10-CM

## 2016-07-03 LAB — POCT URINALYSIS DIPSTICK
BILIRUBIN UA: NEGATIVE
Glucose, UA: NEGATIVE
KETONES UA: NEGATIVE
NITRITE UA: NEGATIVE
PH UA: 6
Spec Grav, UA: 1.025
Urobilinogen, UA: 1

## 2016-07-04 ENCOUNTER — Encounter: Payer: Self-pay | Admitting: *Deleted

## 2016-07-04 ENCOUNTER — Observation Stay
Admission: EM | Admit: 2016-07-04 | Discharge: 2016-07-04 | Disposition: A | Discharge status: Home / Self Care | Payer: Medicaid Other | Attending: Obstetrics and Gynecology | Admitting: Obstetrics and Gynecology

## 2016-07-04 DIAGNOSIS — Z3403 Encounter for supervision of normal first pregnancy, third trimester: Principal | ICD-10-CM | POA: Insufficient documentation

## 2016-07-04 DIAGNOSIS — Z3A38 38 weeks gestation of pregnancy: Secondary | ICD-10-CM

## 2016-07-04 DIAGNOSIS — Z3490 Encounter for supervision of normal pregnancy, unspecified, unspecified trimester: Secondary | ICD-10-CM

## 2016-07-04 NOTE — Discharge Instructions (Signed)
Come back if:  Big gush of fluids Heavy vaginal bleeding Decreased fetal movement Temp over 100.4 Contractions every 3-5 min lasting at least one hour

## 2016-07-04 NOTE — OB Triage Note (Signed)
Recvd pt from ED. Pt c/o LOF that has been occurring at different times throughout the day. Feeling baby move ok, but slightly less than normal. No intercourse in the past 24 hours.

## 2016-07-04 NOTE — Discharge Summary (Signed)
L&D OB Triage Note  [facility] Fleet[facility] is a 26 y.o. G2P1001 female at 2447w0d, EDD Estimated Date of Delivery: 07/18/16 who presented to triage with complaints of possible leaking fluid.   Obstetric History   G2   P1   T1   P0   A0   L1    SAB0   TAB0   Ectopic0   Multiple0   Live Births1     # Outcome Date GA Lbr Len/2nd Weight Sex Delivery Anes PTL Lv  2 Current           1 Term 11/24/13 2799w2d  8 lb 3.9 oz (3.74 kg) F CS-LTranv EPI  LIV     Name: Bourque,GIRL Demara     Apgar1:  8                Apgar5: 9      No prescriptions prior to admission.     OBJECTIVE  Nursing Evaluation:   BP 119/77 (BP Location: Left Arm)   Pulse 100   Temp 98.1 F (36.7 C) (Oral)   Resp 16   Ht 5\' 2"  (1.575 m)   Wt 183 lb (83 kg)   LMP 10/12/2015 (Exact Date)   BMI 33.47 kg/m    Findings:  NTZ negative with no evidence of ROM  NST was performed and has been reviewed by me.  NST INTERPRETATION: Category I  Mode: External Baseline Rate (A): 145 bpm (pt dc) Variability: Moderate Accelerations: 15 x 15 Decelerations: None     Contraction Frequency (min): rare  ASSESSMENT Impression:  1. Pregnancy:  G2P1001 at 2847w0d , EDD Estimated Date of Delivery: 07/18/16 2.  reactive  PLAN 1. Reassurance given 2. Discharge home with precautions to return to L&D or call the office if:  increased leakage or fluid, contractions more than  12 per  1 hour, decreased fetal movement or bleeding from vaginal area  3. Continue routine prenatal care.

## 2016-07-07 NOTE — Progress Notes (Signed)
ROB: Doing well, no complaints.  Was seen in triage on yesterday due to complaints of contractions.  Patient was noted to have no cervical change (ruled out for labor). Was treated with pain meds and IV fluids.  Today presents for pre-op for scheduled C-section on 07/16/16.  All questions answered.  For pre-admit appointment next week.  RTC in 1 week.

## 2016-07-10 ENCOUNTER — Ambulatory Visit (INDEPENDENT_AMBULATORY_CARE_PROVIDER_SITE_OTHER): Payer: Medicaid Other | Admitting: Obstetrics and Gynecology

## 2016-07-10 VITALS — BP 116/74 | HR 106 | Wt 187.6 lb

## 2016-07-10 DIAGNOSIS — Z3493 Encounter for supervision of normal pregnancy, unspecified, third trimester: Secondary | ICD-10-CM

## 2016-07-10 LAB — POCT URINALYSIS DIPSTICK
Bilirubin, UA: NEGATIVE
Glucose, UA: NEGATIVE
KETONES UA: NEGATIVE
LEUKOCYTES UA: NEGATIVE
NITRITE UA: NEGATIVE
PH UA: 6
RBC UA: NEGATIVE
Spec Grav, UA: 1.015
Urobilinogen, UA: 0.2

## 2016-07-10 NOTE — Progress Notes (Signed)
ROB- pt is doing well, denies any complaints 

## 2016-07-10 NOTE — Addendum Note (Signed)
Addended by: Rosine BeatLONTZ, Wilna Pennie L on: 07/10/2016 02:53 PM   Modules accepted: Orders

## 2016-07-10 NOTE — Progress Notes (Signed)
ROB-doing well, no concerns. 

## 2016-07-11 LAB — MONITOR DRUG PROFILE 14(MW)
Amphetamine Scrn, Ur: NEGATIVE ng/mL
BARBITURATE SCREEN URINE: NEGATIVE ng/mL
BENZODIAZEPINE SCREEN, URINE: NEGATIVE ng/mL
BUPRENORPHINE, URINE: NEGATIVE ng/mL
CANNABINOIDS UR QL SCN: NEGATIVE ng/mL
COCAINE(METAB.)SCREEN, URINE: NEGATIVE ng/mL
Creatinine(Crt), U: 150.3 mg/dL (ref 20.0–300.0)
Fentanyl, Urine: NEGATIVE pg/mL
MEPERIDINE SCREEN, URINE: NEGATIVE ng/mL
Methadone Screen, Urine: NEGATIVE ng/mL
OPIATE SCREEN URINE: NEGATIVE ng/mL
OXYCODONE+OXYMORPHONE UR QL SCN: NEGATIVE ng/mL
PHENCYCLIDINE QUANTITATIVE URINE: NEGATIVE ng/mL
PROPOXYPHENE SCREEN URINE: NEGATIVE ng/mL
Ph of Urine: 5.6 (ref 4.5–8.9)
SPECIFIC GRAVITY: 1.022
TRAMADOL SCREEN, URINE: NEGATIVE ng/mL

## 2016-07-14 ENCOUNTER — Inpatient Hospital Stay
Admission: EM | Admit: 2016-07-14 | Discharge: 2016-07-14 | Disposition: A | Discharge status: Home / Self Care | Payer: Medicaid Other | Attending: Obstetrics and Gynecology | Admitting: Obstetrics and Gynecology

## 2016-07-14 DIAGNOSIS — Z79899 Other long term (current) drug therapy: Secondary | ICD-10-CM | POA: Insufficient documentation

## 2016-07-14 DIAGNOSIS — M549 Dorsalgia, unspecified: Secondary | ICD-10-CM | POA: Diagnosis not present

## 2016-07-14 DIAGNOSIS — O26893 Other specified pregnancy related conditions, third trimester: Secondary | ICD-10-CM | POA: Diagnosis not present

## 2016-07-14 DIAGNOSIS — R110 Nausea: Secondary | ICD-10-CM | POA: Diagnosis not present

## 2016-07-14 DIAGNOSIS — O99891 Other specified diseases and conditions complicating pregnancy: Secondary | ICD-10-CM

## 2016-07-14 DIAGNOSIS — Z3A00 Weeks of gestation of pregnancy not specified: Secondary | ICD-10-CM

## 2016-07-14 DIAGNOSIS — Z3A39 39 weeks gestation of pregnancy: Secondary | ICD-10-CM | POA: Insufficient documentation

## 2016-07-14 DIAGNOSIS — Z880 Allergy status to penicillin: Secondary | ICD-10-CM | POA: Insufficient documentation

## 2016-07-14 DIAGNOSIS — O9989 Other specified diseases and conditions complicating pregnancy, childbirth and the puerperium: Secondary | ICD-10-CM

## 2016-07-14 LAB — URINALYSIS, ROUTINE W REFLEX MICROSCOPIC
Bilirubin Urine: NEGATIVE
Glucose, UA: NEGATIVE mg/dL
Hgb urine dipstick: NEGATIVE
Ketones, ur: 5 mg/dL — AB
Nitrite: NEGATIVE
PH: 5 (ref 5.0–8.0)
Protein, ur: NEGATIVE mg/dL
SPECIFIC GRAVITY, URINE: 1.013 (ref 1.005–1.030)

## 2016-07-14 MED ORDER — ZOLPIDEM TARTRATE 5 MG PO TABS
5.0000 mg | ORAL_TABLET | Freq: Every evening | ORAL | Status: DC | PRN
  Administered 2016-07-14: 5 mg via ORAL

## 2016-07-14 MED ORDER — ZOLPIDEM TARTRATE 5 MG PO TABS: 5.0000 mg | ORAL_TABLET | Freq: Once | ORAL | 0 refills | Status: DC

## 2016-07-14 MED ORDER — ZOLPIDEM TARTRATE 5 MG PO TABS
ORAL_TABLET | ORAL | Status: AC
  Administered 2016-07-14: 5 mg via ORAL
  Filled 2016-07-14: qty 1

## 2016-07-14 NOTE — Discharge Instructions (Signed)
Teacher, English as a foreign languagerinted material given to [place] for back stretches during pregnancy. Ice to lower back prn no longer than 20 minutes at a time.

## 2016-07-14 NOTE — Final Progress Note (Signed)
Physician Final Progress Note  Patient ID: Yolanda KendallMichele May-Ray Pierce MRN: [id number] DOB/AGE: [birth year] 24 y.o.  Admit date: 07/14/2016 Admitting provider: Hildred LaserAnika Cherry, MD Discharge date: 07/14/2016   Admission Diagnoses: Back pain and nausea  Discharge Diagnoses:  Lower back pain   Consults: None  Significant Findings/ Diagnostic Studies: U/A  Procedures: reactive NST, category 1 strip. FHR 150's with accelerations, moderate variability. Occasional contractions.   Discharge Condition: good  Disposition: 01-Home or Self Care  Diet: Regular diet  Discharge Activity: Activity as tolerated   Allergies as of 07/14/2016      Reactions   Amoxicillin Rash   Redness and rash all over. Has patient had a PCN reaction causing immediate rash, facial/tongue/throat swelling, SOB or lightheadedness with hypotension: Yes Has patient had a PCN reaction causing severe rash involving mucus membranes or skin necrosis: No Has patient had a PCN reaction that required hospitalization Yes Has patient had a PCN reaction occurring within the last 10 years: No If all of the above answers are "NO", then may proceed with Cephalosporin use.      Medication List    TAKE these medications   acetaminophen 500 MG tablet Commonly known as:  TYLENOL Take 1,000 mg by mouth at bedtime.   diphenhydrAMINE 25 MG tablet Commonly known as:  BENADRYL Take 50 mg by mouth at bedtime.   zolpidem 5 MG tablet Commonly known as:  AMBIEN Take 1 tablet (5 mg total) by mouth once.        Total time spent taking care of this patient: 30 minutes  Signed: Doreene Burkennie Shalinda Burkholder 07/14/2016, 7:36 PM

## 2016-07-14 NOTE — Discharge Summary (Signed)
Physician Discharge Summary  Patient ID: Yolanda Pierce MRN: [id number] DOB/AGE: [birth year] 24 y.o.  Admit date: 07/14/2016 Discharge date: 07/14/2016  Admission Diagnoses: Back pain and nausea Discharge Diagnoses:  Active Problems:   * No active hospital problems. *   Discharged Condition: good  Hospital Course: Triage  Consults: None  Significant Diagnostic Studies: U/A   Treatments: PO fluids, sandwich given. Ice to her back. Ambien   Discharge Exam: Blood pressure 127/77, pulse 95, temperature 97.9 F (36.6 C), temperature source Oral, resp. rate 18, height 5\' 1"  (1.549 m), weight 187 lb 9.6 oz (85.1 kg), last menstrual period 10/12/2015. General appearance: cooperative and appears stated age Back: no tenderness to percussion or palpation, symmetric, no curvature. ROM normal. No CVA tenderness. Resp: clear to auscultation bilaterally Chest wall: no tenderness Skin: Skin color, texture, turgor normal. No rashes or lesions Neurologic: Alert and oriented X 3, normal strength and tone. Normal symmetric reflexes. Normal coordination and gait  Disposition: 01-Home or Self Care  Return in the morning for pre-op appointment.   Allergies as of 07/14/2016      Reactions   Amoxicillin Rash   Redness and rash all over. Has patient had a PCN reaction causing immediate rash, facial/tongue/throat swelling, SOB or lightheadedness with hypotension: Yes Has patient had a PCN reaction causing severe rash involving mucus membranes or skin necrosis: No Has patient had a PCN reaction that required hospitalization Yes Has patient had a PCN reaction occurring within the last 10 years: No If all of the above answers are "NO", then may proceed with Cephalosporin use.      Medication List    TAKE these medications   acetaminophen 500 MG tablet Commonly known as:  TYLENOL Take 1,000 mg by mouth at bedtime.   diphenhydrAMINE 25 MG tablet Commonly known as:  BENADRYL Take 50 mg by  mouth at bedtime.   zolpidem 5 MG tablet Commonly known as:  AMBIEN Take 1 tablet (5 mg total) by mouth once.        Signed: Doreene Burkennie Cosima Prentiss 07/14/2016, 7:35 PM

## 2016-07-14 NOTE — OB Triage Note (Signed)
Presents to triage with c/o severe lower abdominal pain and lower back pain (left side worse than right side of back).  Pt reports taking 2500 mg tylenol today (once at 5Am and once at 1600  PM) with no relief.  Denies bleedling/LOF.  Reports nausea starting at 0400 with pain.

## 2016-07-14 NOTE — Plan of Care (Signed)
Discharge instructions, both oral and written, given to pt and SO.  Pt ready for discharge home in stable condition ambulatory. Pt has next appt tomorrow morning at 0800 for preop. Ellison Carwin Marguerita Stapp RNC

## 2016-07-15 ENCOUNTER — Encounter
Admission: RE | Admit: 2016-07-15 | Discharge: 2016-07-15 | Disposition: A | Discharge status: Home / Self Care | Payer: Medicaid Other | Source: Ambulatory Visit | Attending: Obstetrics and Gynecology | Admitting: Obstetrics and Gynecology

## 2016-07-15 DIAGNOSIS — Z01812 Encounter for preprocedural laboratory examination: Secondary | ICD-10-CM

## 2016-07-15 DIAGNOSIS — Z3A39 39 weeks gestation of pregnancy: Secondary | ICD-10-CM

## 2016-07-15 LAB — CBC
HCT: 30.6 % — ABNORMAL LOW (ref 35.0–47.0)
HEMOGLOBIN: 9.7 g/dL — AB (ref 12.0–16.0)
MCH: 24.4 pg — AB (ref 26.0–34.0)
MCHC: 31.8 g/dL — ABNORMAL LOW (ref 32.0–36.0)
MCV: 76.8 fL — AB (ref 80.0–100.0)
PLATELETS: 205 10*3/uL (ref 150–440)
RBC: 3.99 MIL/uL (ref 3.80–5.20)
RDW: 16.2 % — ABNORMAL HIGH (ref 11.5–14.5)
WBC: 11.9 10*3/uL — AB (ref 3.6–11.0)

## 2016-07-15 LAB — URINE DRUG SCREEN, QUALITATIVE (ARMC ONLY)
Amphetamines, Ur Screen: NOT DETECTED
BARBITURATES, UR SCREEN: NOT DETECTED
Benzodiazepine, Ur Scrn: NOT DETECTED
CANNABINOID 50 NG, UR ~~LOC~~: NOT DETECTED
COCAINE METABOLITE, UR ~~LOC~~: NOT DETECTED
MDMA (ECSTASY) UR SCREEN: NOT DETECTED
Methadone Scn, Ur: NOT DETECTED
Opiate, Ur Screen: NOT DETECTED
Phencyclidine (PCP) Ur S: NOT DETECTED
Tricyclic, Ur Screen: NOT DETECTED

## 2016-07-15 LAB — RAPID HIV SCREEN (HIV 1/2 AB+AG)
HIV 1/2 Antibodies: NONREACTIVE
HIV-1 P24 Antigen - HIV24: NONREACTIVE

## 2016-07-15 LAB — TYPE AND SCREEN
ABO/RH(D): A POS
ANTIBODY SCREEN: NEGATIVE
EXTEND SAMPLE REASON: UNDETERMINED

## 2016-07-15 NOTE — Patient Instructions (Signed)
  Your procedure is scheduled on: July 16, 2016 (TUESDAY) Report to EMERGENCY DEPARTMENT ARRIVAL TIME 5:30 A. M.  Remember: Instructions that are not followed completely may result in serious medical risk, up to and including death, or upon the discretion of your surgeon and anesthesiologist your surgery may need to be rescheduled.    _x___ 1. Do not eat food or drink liquids after midnight. No gum chewing or hard candies.     __x__ 2. No Alcohol for 24 hours before or after surgery.   __x__3. No Smoking for 24 prior to surgery.   ____  4. Bring all medications with you on the day of surgery if instructed.    __x__ 5. Notify your doctor if there is any change in your medical condition     (cold, fever, infections).     Do not wear jewelry, make-up, hairpins, clips or nail polish.  Do not wear lotions, powders, or perfumes. You may wear deodorant.  Do not shave 48 hours prior to surgery. Men may shave face and neck.  Do not bring valuables to the hospital.    [facility] belongings or valuables.               Contacts, dentures or bridgework may not be worn into surgery.  Leave your suitcase in the car. After surgery it may be brought to your room.  For patients admitted to the hospital, discharge time is determined by your treatment team.   Patients discharged the day of surgery will not be allowed to drive home.  You will need someone to drive you home and stay with you the night of your procedure.    Please read over the following fact sheets that you were given:   [facility] and or MRSA Information   _x___ Take these medicines the morning of surgery with A SIP OF WATER:    1.   2.  3.  4.  5.  6.  ____Fleets enema or Magnesium Citrate as directed.   _x___ Use CHG Soap or sage wipes as directed on instruction sheet   ____ Use inhalers on the day of surgery and bring to hospital day of surgery  ____ Stop metformin 2  days prior to surgery    ____ Take 1/2 of usual insulin dose the night before surgery and none on the morning of           surgery.   ____ Stop Aspirin, Coumadin, Pllavix ,Eliquis, Effient, or Pradaxa  x__ Stop Anti-inflammatories such as Advil, Aleve, Ibuprofen, Motrin, Naproxen,          Naprosyn, Goodies powders or aspirin products. Ok to take Tylenol.   ____ Stop supplements until after surgery.    ____ Bring C-Pap to the hospital.

## 2016-07-16 ENCOUNTER — Inpatient Hospital Stay: Payer: Medicaid Other | Admitting: Anesthesiology

## 2016-07-16 ENCOUNTER — Encounter
Admission: EM | Disposition: A | Discharge status: Home / Self Care | Payer: Self-pay | Source: Home / Self Care | Attending: Obstetrics and Gynecology

## 2016-07-16 ENCOUNTER — Inpatient Hospital Stay
Admission: RE | Admit: 2016-07-16 | Payer: Medicaid Other | Source: Ambulatory Visit | Admitting: Obstetrics and Gynecology

## 2016-07-16 ENCOUNTER — Inpatient Hospital Stay
Admission: EM | Admit: 2016-07-16 | Discharge: 2016-07-18 | DRG: 765 | Disposition: A | Discharge status: Home / Self Care | Payer: Medicaid Other | Attending: Obstetrics and Gynecology | Admitting: Obstetrics and Gynecology

## 2016-07-16 DIAGNOSIS — Z3A39 39 weeks gestation of pregnancy: Secondary | ICD-10-CM | POA: Diagnosis not present

## 2016-07-16 DIAGNOSIS — O9081 Anemia of the puerperium: Secondary | ICD-10-CM | POA: Diagnosis not present

## 2016-07-16 DIAGNOSIS — Z98891 History of uterine scar from previous surgery: Secondary | ICD-10-CM

## 2016-07-16 DIAGNOSIS — Z3A38 38 weeks gestation of pregnancy: Secondary | ICD-10-CM

## 2016-07-16 DIAGNOSIS — Z833 Family history of diabetes mellitus: Secondary | ICD-10-CM | POA: Diagnosis not present

## 2016-07-16 DIAGNOSIS — Z8249 Family history of ischemic heart disease and other diseases of the circulatory system: Secondary | ICD-10-CM

## 2016-07-16 DIAGNOSIS — D62 Acute posthemorrhagic anemia: Secondary | ICD-10-CM | POA: Diagnosis not present

## 2016-07-16 DIAGNOSIS — Z87891 Personal history of nicotine dependence: Secondary | ICD-10-CM

## 2016-07-16 DIAGNOSIS — O34211 Maternal care for low transverse scar from previous cesarean delivery: Secondary | ICD-10-CM | POA: Diagnosis present

## 2016-07-16 DIAGNOSIS — Z3490 Encounter for supervision of normal pregnancy, unspecified, unspecified trimester: Secondary | ICD-10-CM

## 2016-07-16 LAB — RPR: RPR Ser Ql: NONREACTIVE

## 2016-07-16 SURGERY — Surgical Case
Anesthesia: Spinal

## 2016-07-16 MED ORDER — DIPHENHYDRAMINE HCL 50 MG/ML IJ SOLN: 12.5000 mg | INTRAMUSCULAR | Status: DC | PRN

## 2016-07-16 MED ORDER — LACTATED RINGERS IV SOLN: INTRAVENOUS | Status: DC

## 2016-07-16 MED ORDER — MAGNESIUM HYDROXIDE 400 MG/5ML PO SUSP
30.0000 mL | ORAL | Status: DC | PRN
  Filled 2016-07-16: qty 30

## 2016-07-16 MED ORDER — LACTATED RINGERS IV SOLN
INTRAVENOUS | Status: DC
  Administered 2016-07-16 (×2): via INTRAVENOUS

## 2016-07-16 MED ORDER — COCONUT OIL OIL
1.0000 | TOPICAL_OIL | Status: DC | PRN
Start: 2016-07-16 — End: 2016-07-18
  Administered 2016-07-17: 1 via TOPICAL
  Filled 2016-07-16: qty 120

## 2016-07-16 MED ORDER — NALBUPHINE HCL 10 MG/ML IJ SOLN: 5.0000 mg | INTRAMUSCULAR | Status: DC | PRN

## 2016-07-16 MED ORDER — LIDOCAINE 5 % EX PTCH
MEDICATED_PATCH | CUTANEOUS | Status: DC | PRN
  Administered 2016-07-16: 1 via TRANSDERMAL

## 2016-07-16 MED ORDER — DIBUCAINE 1 % RE OINT: 1.0000 "application " | TOPICAL_OINTMENT | RECTAL | Status: DC | PRN

## 2016-07-16 MED ORDER — SENNOSIDES-DOCUSATE SODIUM 8.6-50 MG PO TABS
2.0000 | ORAL_TABLET | ORAL | Status: DC
  Administered 2016-07-17 – 2016-07-18 (×2): 2 via ORAL
  Filled 2016-07-16 (×2): qty 2

## 2016-07-16 MED ORDER — PHENYLEPHRINE HCL 10 MG/ML IJ SOLN
INTRAMUSCULAR | Status: DC | PRN
  Administered 2016-07-16 (×4): 100 ug via INTRAVENOUS
  Administered 2016-07-16: 150 ug via INTRAVENOUS
  Administered 2016-07-16: 100 ug via INTRAVENOUS

## 2016-07-16 MED ORDER — FERROUS SULFATE 325 (65 FE) MG PO TABS
325.0000 mg | ORAL_TABLET | Freq: Three times a day (TID) | ORAL | Status: DC
  Administered 2016-07-16 – 2016-07-18 (×6): 325 mg via ORAL
  Filled 2016-07-16 (×6): qty 1

## 2016-07-16 MED ORDER — LIDOCAINE 5 % EX PTCH
1.0000 | MEDICATED_PATCH | CUTANEOUS | Status: DC
  Filled 2016-07-16: qty 1

## 2016-07-16 MED ORDER — WITCH HAZEL-GLYCERIN EX PADS: 1.0000 "application " | MEDICATED_PAD | CUTANEOUS | Status: DC | PRN

## 2016-07-16 MED ORDER — LACTATED RINGERS IV SOLN: Freq: Once | INTRAVENOUS | Status: DC

## 2016-07-16 MED ORDER — DIPHENHYDRAMINE HCL 25 MG PO CAPS: 25.0000 mg | ORAL_CAPSULE | Freq: Four times a day (QID) | ORAL | Status: DC | PRN

## 2016-07-16 MED ORDER — OXYTOCIN 40 UNITS IN LACTATED RINGERS INFUSION - SIMPLE MED
INTRAVENOUS | Status: DC | PRN
  Administered 2016-07-16: 1000 mL via INTRAVENOUS

## 2016-07-16 MED ORDER — BUPIVACAINE HCL (PF) 0.5 % IJ SOLN
INTRAMUSCULAR | Status: AC
  Filled 2016-07-16: qty 30

## 2016-07-16 MED ORDER — DEXTROSE 5 % IV SOLN
2.0000 g | INTRAVENOUS | Status: AC
  Administered 2016-07-16: 2 g via INTRAVENOUS
  Filled 2016-07-16: qty 2

## 2016-07-16 MED ORDER — PRENATAL MULTIVITAMIN CH
1.0000 | ORAL_TABLET | Freq: Every day | ORAL | Status: DC
Start: 2016-07-16 — End: 2016-07-18
  Administered 2016-07-17 – 2016-07-18 (×2): 1 via ORAL
  Filled 2016-07-16 (×3): qty 1

## 2016-07-16 MED ORDER — ONDANSETRON HCL 4 MG/2ML IJ SOLN
INTRAMUSCULAR | Status: DC | PRN
  Administered 2016-07-16: 4 mg via INTRAVENOUS

## 2016-07-16 MED ORDER — KETOROLAC TROMETHAMINE 30 MG/ML IJ SOLN: 30.0000 mg | Freq: Four times a day (QID) | INTRAMUSCULAR | Status: AC

## 2016-07-16 MED ORDER — OXYTOCIN 40 UNITS IN LACTATED RINGERS INFUSION - SIMPLE MED
INTRAVENOUS | Status: AC
  Filled 2016-07-16: qty 1000

## 2016-07-16 MED ORDER — KETOROLAC TROMETHAMINE 30 MG/ML IJ SOLN
30.0000 mg | Freq: Four times a day (QID) | INTRAMUSCULAR | Status: AC
  Administered 2016-07-16 – 2016-07-17 (×4): 30 mg via INTRAVENOUS
  Filled 2016-07-16 (×4): qty 1

## 2016-07-16 MED ORDER — ZOLPIDEM TARTRATE 5 MG PO TABS: 5.0000 mg | ORAL_TABLET | Freq: Every evening | ORAL | Status: DC | PRN

## 2016-07-16 MED ORDER — BUPIVACAINE IN DEXTROSE 0.75-8.25 % IT SOLN
INTRATHECAL | Status: DC | PRN
  Administered 2016-07-16: 1.8 mL via INTRATHECAL

## 2016-07-16 MED ORDER — NALBUPHINE HCL 10 MG/ML IJ SOLN: 5.0000 mg | Freq: Once | INTRAMUSCULAR | Status: DC | PRN

## 2016-07-16 MED ORDER — SOD CITRATE-CITRIC ACID 500-334 MG/5ML PO SOLN
ORAL | Status: AC
  Administered 2016-07-16: 30 mL
  Filled 2016-07-16: qty 15

## 2016-07-16 MED ORDER — SODIUM CHLORIDE 0.9% FLUSH: 3.0000 mL | INTRAVENOUS | Status: DC | PRN

## 2016-07-16 MED ORDER — OXYTOCIN 40 UNITS IN LACTATED RINGERS INFUSION - SIMPLE MED: 2.5000 [IU]/h | INTRAVENOUS | Status: DC

## 2016-07-16 MED ORDER — MENTHOL 3 MG MT LOZG
1.0000 | LOZENGE | OROMUCOSAL | Status: DC | PRN
  Filled 2016-07-16: qty 9

## 2016-07-16 MED ORDER — ACETAMINOPHEN 325 MG PO TABS
650.0000 mg | ORAL_TABLET | Freq: Four times a day (QID) | ORAL | Status: DC
  Administered 2016-07-16 – 2016-07-17 (×3): 650 mg via ORAL
  Filled 2016-07-16 (×3): qty 2

## 2016-07-16 MED ORDER — DEXTROSE 5 % IV SOLN
1.0000 ug/kg/h | INTRAVENOUS | Status: DC | PRN
  Filled 2016-07-16: qty 2

## 2016-07-16 MED ORDER — SIMETHICONE 80 MG PO CHEW: 80.0000 mg | CHEWABLE_TABLET | ORAL | Status: DC | PRN

## 2016-07-16 MED ORDER — SIMETHICONE 80 MG PO CHEW: 80.0000 mg | CHEWABLE_TABLET | ORAL | Status: DC

## 2016-07-16 MED ORDER — FENTANYL CITRATE (PF) 100 MCG/2ML IJ SOLN
INTRAMUSCULAR | Status: DC | PRN
  Administered 2016-07-16: 15 ug via INTRATHECAL

## 2016-07-16 MED ORDER — OXYCODONE HCL 5 MG PO TABS
5.0000 mg | ORAL_TABLET | ORAL | Status: DC | PRN
  Administered 2016-07-16 – 2016-07-17 (×2): 5 mg via ORAL
  Filled 2016-07-16 (×3): qty 1

## 2016-07-16 MED ORDER — CEFAZOLIN SODIUM-DEXTROSE 2-3 GM-% IV SOLR
INTRAVENOUS | Status: DC | PRN
  Administered 2016-07-16: 2 g via INTRAVENOUS

## 2016-07-16 MED ORDER — FERROUS SULFATE 325 (65 FE) MG PO TABS: 325.0000 mg | ORAL_TABLET | Freq: Two times a day (BID) | ORAL | Status: DC

## 2016-07-16 MED ORDER — OXYCODONE-ACETAMINOPHEN 5-325 MG PO TABS
2.0000 | ORAL_TABLET | ORAL | Status: DC | PRN
  Administered 2016-07-17: 2 via ORAL
  Filled 2016-07-16: qty 2

## 2016-07-16 MED ORDER — OXYCODONE-ACETAMINOPHEN 5-325 MG PO TABS
1.0000 | ORAL_TABLET | ORAL | Status: DC | PRN
  Administered 2016-07-17 – 2016-07-18 (×3): 1 via ORAL
  Filled 2016-07-16 (×3): qty 1

## 2016-07-16 MED ORDER — DIPHENHYDRAMINE HCL 25 MG PO CAPS: 25.0000 mg | ORAL_CAPSULE | ORAL | Status: DC | PRN

## 2016-07-16 MED ORDER — LACTATED RINGERS IV SOLN
INTRAVENOUS | Status: DC | PRN
  Administered 2016-07-16 (×2): via INTRAVENOUS

## 2016-07-16 MED ORDER — NALOXONE HCL 0.4 MG/ML IJ SOLN: 0.4000 mg | INTRAMUSCULAR | Status: DC | PRN

## 2016-07-16 MED ORDER — IBUPROFEN 600 MG PO TABS
600.0000 mg | ORAL_TABLET | Freq: Four times a day (QID) | ORAL | Status: DC
  Administered 2016-07-17 – 2016-07-18 (×5): 600 mg via ORAL
  Filled 2016-07-16 (×5): qty 1

## 2016-07-16 MED ORDER — ACETAMINOPHEN 325 MG PO TABS: 650.0000 mg | ORAL_TABLET | ORAL | Status: DC | PRN

## 2016-07-16 MED ORDER — MORPHINE SULFATE (PF) 0.5 MG/ML IJ SOLN
INTRAMUSCULAR | Status: DC | PRN
  Administered 2016-07-16: .1 mg via INTRATHECAL

## 2016-07-16 MED ORDER — OXYCODONE HCL 5 MG PO TABS: 10.0000 mg | ORAL_TABLET | ORAL | Status: DC | PRN

## 2016-07-16 MED ORDER — ONDANSETRON HCL 4 MG/2ML IJ SOLN: 4.0000 mg | Freq: Three times a day (TID) | INTRAMUSCULAR | Status: DC | PRN

## 2016-07-16 SURGICAL SUPPLY — 23 items
BAG COUNTER SPONGE EZ (MISCELLANEOUS) ×2 IMPLANT
CANISTER SUCT 3000ML (MISCELLANEOUS) ×3 IMPLANT
CHLORAPREP W/TINT 26ML (MISCELLANEOUS) ×6 IMPLANT
COUNTER SPONGE BAG EZ (MISCELLANEOUS) ×1
DRSG TELFA 3X8 NADH (GAUZE/BANDAGES/DRESSINGS) ×3 IMPLANT
ELECT REM PT RETURN 9FT ADLT (ELECTROSURGICAL) ×3
ELECTRODE REM PT RTRN 9FT ADLT (ELECTROSURGICAL) ×1 IMPLANT
GAUZE SPONGE 4X4 12PLY STRL (GAUZE/BANDAGES/DRESSINGS) ×3 IMPLANT
GLOVE BIO SURGEON STRL SZ 6.5 (GLOVE) ×2 IMPLANT
GLOVE BIO SURGEONS STRL SZ 6.5 (GLOVE) ×1
GLOVE INDICATOR 7.0 STRL GRN (GLOVE) ×3 IMPLANT
GOWN STRL REUS W/ TWL LRG LVL3 (GOWN DISPOSABLE) ×2 IMPLANT
GOWN STRL REUS W/TWL LRG LVL3 (GOWN DISPOSABLE) ×4
KIT RM TURNOVER STRD PROC AR (KITS) ×3 IMPLANT
NS IRRIG 1000ML POUR BTL (IV SOLUTION) ×3 IMPLANT
PACK C SECTION AR (MISCELLANEOUS) ×3 IMPLANT
PAD OB MATERNITY 4.3X12.25 (PERSONAL CARE ITEMS) ×3 IMPLANT
PAD PREP 24X41 OB/GYN DISP (PERSONAL CARE ITEMS) ×3 IMPLANT
SUT MNCRL AB 4-0 PS2 18 (SUTURE) ×3 IMPLANT
SUT PLAIN 2 0 XLH (SUTURE) IMPLANT
SUT VIC AB 0 CT1 36 (SUTURE) ×12 IMPLANT
SUT VIC AB 3-0 SH 27 (SUTURE) ×2
SUT VIC AB 3-0 SH 27X BRD (SUTURE) ×1 IMPLANT

## 2016-07-16 NOTE — Anesthesia Post-op Follow-up Note (Cosign Needed)
Anesthesia QCDR form completed.        

## 2016-07-16 NOTE — Anesthesia Procedure Notes (Signed)
Date/Time: 07/16/2016 7:45 AM Performed by: Henrietta HooverPOPE, Gilman Olazabal Pre-anesthesia Checklist: Patient identified, Emergency Drugs available, Suction available, Patient being monitored and Timeout performed Oxygen Delivery Method: Nasal cannula

## 2016-07-16 NOTE — H&P (Signed)
Obstetric Preoperative History and Physical  Yolanda Pierce is a 26 y.o. G2P1001 with IUP at [redacted]w[redacted]d presenting for presenting for scheduled repeat cesarean section.  No acute concerns.   Prenatal Course Source of Care: [facility] with onset of care at 13 weeks Pregnancy complications or risks: Patient Active Problem List   Diagnosis Date Noted  . Back pain affecting pregnancy in third trimester   . Sciatic leg pain 05/10/2016  . 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 Nausea and vomiting during pregnancy 11/28/2015  . History of cesarean section 11/28/2015  . Pregnancy 11/28/2015   She plans to breastfeed She desires OCPs vs IUD for postpartum contraception.   Prenatal labs and studies: ABO, Rh: --/--/A POS (03/05 0454) Antibody: NEG (03/05 0905) Rubella: 1.06 (08/07 1602) RPR: Non Reactive (03/05 0905)  HBsAg: Negative (08/07 1602)  HIV: Non Reactive (08/07 1602)  GBS: Negative 1 hr Glucola  Normal (109) Genetic screening normal Anatomy US normal   Past Medical History:  Diagnosis Date  . Anemia    On Iron tablets,once daily  . Anxiety   . Depression     Past Surgical History:  Procedure Laterality Date  . CESAREAN SECTION N/A 11/24/2013   Procedure: CESAREAN SECTION;  Surgeon: Essie Hart, MD;  Location: [facility];  Service: Obstetrics;  Laterality: N/A;  . cesarean section  2015  . NO PAST SURGERIES      OB History  Gravida Para Term Preterm AB Living  2 1 1  0 0 1  SAB TAB Ectopic Multiple Live Births  0 0 0 0 1    # Outcome Date GA Lbr Len/2nd Weight Sex Delivery Anes PTL Lv  2 Current           1 Term 11/24/13 [redacted]w[redacted]d  8 lb 3.9 oz (3.74 kg) F CS-LTranv EPI  LIV      Social History   Social History  . Marital status: Single    Spouse name: N/A  . Number of children: N/A  . Years of education: N/A   Social History Main Topics  . Smoking status: Former Games developer  . Smokeless tobacco: Never Used  . Alcohol use No  . Drug use: Yes    Types: Marijuana     Comment: before pregnancy  . Sexual activity: Yes    Birth control/ protection: None   Other Topics Concern  . None   Social History Narrative  . None    Family History  Problem Relation Age of Onset  . Asthma Mother   . Hypertension Mother   . Diabetes Father   . Hypertension Father   . Sleep apnea Father   . Cancer Neg Hx     Prescriptions Prior to Admission  Medication Sig Dispense Refill Last Dose  . acetaminophen (TYLENOL) 500 MG tablet Take 1,000 mg by mouth at bedtime.    07/15/2016 at 17:00  . diphenhydrAMINE (BENADRYL) 25 MG tablet Take 50 mg by mouth at bedtime.    Past Week at Unknown time  . zolpidem (AMBIEN) 5 MG tablet Take 1 tablet (5 mg total) by mouth once. 1 tablet 0     Allergies  Allergen Reactions  . Amoxicillin Rash    Redness and rash all over. Has patient had a PCN reaction causing immediate rash, facial/tongue/throat swelling, SOB or lightheadedness with hypotension: Yes Has patient had a PCN reaction causing severe rash involving mucus membranes or skin necrosis: No Has patient had a PCN reaction that required hospitalization  Yes Has patient had a PCN reaction occurring within the last 10 years: No If all of the above answers are "NO", then may proceed with Cephalosporin use.    Review of Systems: Negative except for what is mentioned in HPI.  Physical Exam: BP 118/82 (BP Location: Left Arm)   Pulse (!) 113   Temp 98.8 F (37.1 C) (Oral)   Resp 18   Ht 5\' 1"  (1.549 m)   Wt 187 lb (84.8 kg)   LMP 10/12/2015 (Exact Date)   SpO2 100%   BMI 35.33 kg/m  FHR by Doppler: 130 bpm GENERAL: Well-developed, well-nourished female in no acute distress.  LUNGS: Clear to auscultation bilaterally.  HEART: Regular rate and rhythm. ABDOMEN: Soft, nontender, nondistended, gravid, well-healed Pfannenstiel incision. PELVIC: Deferred EXTREMITIES: Nontender, no edema, 2+ distal pulses.   Pertinent Labs/Studies:   Results for orders  placed or performed during the hospital encounter of 07/15/16 (from the past 72 hour(s))  CBC     Status: Abnormal   Collection Time: 07/15/16  9:05 AM  Result Value Ref Range   WBC 11.9 (H) 3.6 - 11.0 K/uL   RBC 3.99 3.80 - 5.20 MIL/uL   Hemoglobin 9.7 (L) 12.0 - 16.0 g/dL   HCT 16.1 (L) 09.6 - 04.5 %   MCV 76.8 (L) 80.0 - 100.0 fL   MCH 24.4 (L) 26.0 - 34.0 pg   MCHC 31.8 (L) 32.0 - 36.0 g/dL   RDW 40.9 (H) 81.1 - 91.4 %   Platelets 205 150 - 440 K/uL  Rapid HIV screen (HIV 1/2 Ab+Ag)     Status: None   Collection Time: 07/15/16  9:05 AM  Result Value Ref Range   HIV-1 P24 Antigen - HIV24 NON REACTIVE NON REACTIVE   HIV 1/2 Antibodies NON REACTIVE NON REACTIVE   Interpretation (HIV Ag Ab)      A non reactive test result means that HIV 1 or HIV 2 antibodies and HIV 1 p24 antigen were not detected in the specimen.  RPR     Status: None   Collection Time: 07/15/16  9:05 AM  Result Value Ref Range   RPR Ser Ql Non Reactive Non Reactive    Comment: (NOTE) Performed At: [facility] [address] [place], [place] [id number] Mila Homer MD [id number]   Type and screen     Status: None   Collection Time: 07/15/16  9:05 AM  Result Value Ref Range   ABO/RH(D) A POS    Antibody Screen NEG    Sample Expiration 07/18/2016    Extend sample reason PREGNANT WITHIN 3 MONTHS, UNABLE TO EXTEND   Urine Drug Screen, Qualitative ([facility] only)     Status: None   Collection Time: 07/15/16  9:05 AM  Result Value Ref Range   Tricyclic, Ur Screen NONE DETECTED NONE DETECTED   Amphetamines, Ur Screen NONE DETECTED NONE DETECTED   MDMA (Ecstasy)Ur Screen NONE DETECTED NONE DETECTED   Cocaine Metabolite,Ur [place] NONE DETECTED NONE DETECTED   Opiate, Ur Screen NONE DETECTED NONE DETECTED   Phencyclidine (PCP) Ur S NONE DETECTED NONE DETECTED   Cannabinoid 50 Ng, Ur [place] NONE DETECTED NONE DETECTED   Barbiturates, Ur Screen NONE DETECTED NONE DETECTED   Benzodiazepine, Ur Scrn NONE  DETECTED NONE DETECTED   Methadone Scn, Ur NONE DETECTED NONE DETECTED    Comment: (NOTE) 100  Tricyclics, urine               Cutoff 1000 ng/mL 200  Amphetamines,  urine             Cutoff 1000 ng/mL 300  MDMA (Ecstasy), urine           Cutoff 500 ng/mL 400  Cocaine Metabolite, urine       Cutoff 300 ng/mL 500  Opiate, urine                   Cutoff 300 ng/mL 600  Phencyclidine (PCP), urine      Cutoff 25 ng/mL 700  Cannabinoid, urine              Cutoff 50 ng/mL 800  Barbiturates, urine             Cutoff 200 ng/mL 900  Benzodiazepine, urine           Cutoff 200 ng/mL 1000 Methadone, urine                Cutoff 300 ng/mL 1100 1200 The urine drug screen provides only a preliminary, unconfirmed 1300 analytical test result and should not be used for non-medical 1400 purposes. Clinical consideration and professional judgment should 1500 be applied to any positive drug screen result due to possible 1600 interfering substances. A more specific alternate chemical method 1700 must be used in order to obtain a confirmed analytical result.  1800 Gas chromato graphy / mass spectrometry (GC/MS) is the preferred 1900 confirmatory method.     Assessment and Plan :Yolanda Pierce is a 26 y.o. G2P1001 at 7614w5d being admitted  for scheduled repeat cesarean section delivery . The patient is understanding of the planned procedure and is aware of and accepting of all surgical risks, including but not limited to: bleeding which may require transfusion or reoperation; infection which may require antibiotics; injury to bowel, bladder, ureters or other surrounding organs which may require repair; injury to the fetus; need for additional procedures including hysterectomy in the event of life-threatening complications; placental abnormalities wth subsequent pregnancies; incisional problems; blood clot disorders which may require blood thinners;, and other postoperative/anesthesia complications. The patient  is in agreement with the proposed plan, and gives informed written consent for the procedure. All questions have been answered.  Hildred LaserAnika Daneshia Tavano, MD [facility] 07/16/2016 7:04 AM

## 2016-07-16 NOTE — Transfer of Care (Signed)
Immediate Anesthesia Transfer of Care Note  Patient: Yolanda Pierce  Procedure(s) Performed: Procedure(s): REPEAT CESAREAN SECTION (N/A)  Patient Location: PACU  Anesthesia Type:Spinal  Level of Consciousness: awake  Airway & Oxygen Therapy: Patient Spontanous Breathing  Post-op Assessment: Report given to RN and Post -op Vital signs reviewed and stable  Post vital signs: Reviewed and stable  Last Vitals:  Vitals:   07/16/16 0544  BP: 118/82  Pulse: (!) 113  Resp: 18  Temp: 37.1 C    Last Pain:  Vitals:   07/16/16 0544  TempSrc: Oral  PainSc: 0-No pain         Complications: No apparent anesthesia complications

## 2016-07-16 NOTE — Anesthesia Preprocedure Evaluation (Signed)
Anesthesia Evaluation  Patient identified by MRN, date of birth, ID band Patient awake    Reviewed: Allergy & Precautions, NPO status , Patient's Chart, lab work & pertinent test results  History of Anesthesia Complications Negative for: history of anesthetic complications  Airway Mallampati: III  TM Distance: >3 FB Neck ROM: Full    Dental no notable dental hx.    Pulmonary neg sleep apnea, neg COPD, former smoker,    breath sounds clear to auscultation- rhonchi (-) wheezing      Cardiovascular Exercise Tolerance: Good (-) hypertension(-) CAD and (-) Past MI  Rhythm:Regular Rate:Normal - Systolic murmurs and - Diastolic murmurs    Neuro/Psych PSYCHIATRIC DISORDERS Anxiety Depression negative neurological ROS     GI/Hepatic negative GI ROS, Neg liver ROS,   Endo/Other  negative endocrine ROSneg diabetes  Renal/GU negative Renal ROS     Musculoskeletal negative musculoskeletal ROS (+)   Abdominal (+) + obese, Gravid abdomen  Peds  Hematology  (+) anemia ,   Anesthesia Other Findings 1 prior CS for failure to progress   Reproductive/Obstetrics                             Anesthesia Physical Anesthesia Plan  ASA: II  Anesthesia Plan: Spinal   Post-op Pain Management:    Induction:   Airway Management Planned: Natural Airway  Additional Equipment:   Intra-op Plan:   Post-operative Plan:   Informed Consent: I have reviewed the patients History and Physical, chart, labs and discussed the procedure including the risks, benefits and alternatives for the proposed anesthesia with the patient or authorized representative who has indicated his/her understanding and acceptance.   Dental advisory given  Plan Discussed with: Anesthesiologist and CRNA  Anesthesia Plan Comments:         Lab Results  Component Value Date   WBC 11.9 (H) 07/15/2016   HGB 9.7 (L) 07/15/2016   HCT  30.6 (L) 07/15/2016   MCV 76.8 (L) 07/15/2016   PLT 205 07/15/2016    Anesthesia Quick Evaluation

## 2016-07-16 NOTE — Op Note (Signed)
Cesarean Section Procedure Note  Indications: previous uterine incision prior low transverse Cesarean  Pre-operative Diagnosis: 39 week 5 day pregnancy, h/o prior C-section x 1 desiring repeat.  Post-operative Diagnosis: Same  Surgeon: Hildred LaserAnika Neima Lacross, MD  Assistants: Harlow MaresMelody Shambley, CNM and Doreene BurkeAnnie Thompson, CNM  Procedure: Repeat low transverse Cesarean Section  Anesthesia: Spinal anesthesia  Procedure Details: The patient was seen in the Holding Room. The risks, benefits, complications, treatment options, and expected outcomes were discussed with the patient.  The patient concurred with the proposed plan, giving informed consent.  The site of surgery properly noted/marked. The patient was taken to the Operating Room, [name] and the procedure verified as C-Section Delivery.   After induction of anesthesia, the patient was draped and prepped in the usual sterile manner. Anesthesia was tested and noted to be adequate. A Time Out was held and the above information confirmed. A Pfannenstiel incision was made and carried down through the subcutaneous tissue to the fascia. Fascial incision was made and extended transversely. The fascia was separated from the underlying rectus tissue superiorly and inferiorly. The peritoneum was identified and entered. Peritoneal incision was extended longitudinally. The utero-vesical peritoneal reflection was incised transversely and the bladder flap was bluntly freed from the lower uterine segment. A low transverse uterine incision was made. Delivered from cephalic presentation was a 4450 gram Female with Apgar scores of 8 at one minute and 9 at five minutes.After the umbilical cord was clamped and cut cord blood was obtained for evaluation. The placenta was removed intact and appeared normal. The uterus was exteriorized and cleared of all clots and debris. There was a thick adhesion band of the peritoneum to the anterior surface of the  uterus which was lysed using the bovie. The uterine outline, tubes and ovaries appeared otherwise normal.  The uterine incision was closed with running locked sutures of 0-Vicryl.  A second suture of 0-Vicryl was used in an imbricating layer.  Hemostasis was observed. Lavage was carried out until clear. The fascia was then reapproximated with a running suture of 0-Vicryl. The subcutaneous fat layer was reapproximated with 3-0 Vicryl with interrupted sutures.  The skin was reapproximated with 4-0 Monocryl.  Instrument, sponge, and needle counts were correct prior the abdominal closure and at the conclusion of the case.   Findings: Female infant, cephalic presentation, OP position, 4450 grams, with Apgar scores of 8 at one minute and 9 at five minutes. Intact placenta with 3 vessel cord.  The uterine outline, tubes and ovaries appeared normal.   Estimated Blood Loss:  700 ml      Drains: foley catheter to gravity drainage, 300 clear urine at end of the procedure         Total IV Fluids:  1600 ml  Specimens: None         Implants: None         Complications:  None; patient tolerated the procedure well.         Disposition: PACU - hemodynamically stable.         Condition: stable   Hildred LaserAnika Damire Remedios, MD [facility]

## 2016-07-16 NOTE — Progress Notes (Signed)
Care of pt assumed after bedside report. Pt c/o back pain with ctx, rates pain 8/10.

## 2016-07-17 LAB — CBC
HCT: 26.6 % — ABNORMAL LOW (ref 35.0–47.0)
Hemoglobin: 8.7 g/dL — ABNORMAL LOW (ref 12.0–16.0)
MCH: 24.5 pg — ABNORMAL LOW (ref 26.0–34.0)
MCHC: 32.9 g/dL (ref 32.0–36.0)
MCV: 74.3 fL — AB (ref 80.0–100.0)
PLATELETS: 167 10*3/uL (ref 150–440)
RBC: 3.58 MIL/uL — AB (ref 3.80–5.20)
RDW: 16.3 % — AB (ref 11.5–14.5)
WBC: 14.3 10*3/uL — AB (ref 3.6–11.0)

## 2016-07-17 MED ORDER — LIDOCAINE 5 % EX PTCH
1.0000 | MEDICATED_PATCH | CUTANEOUS | Status: DC
  Administered 2016-07-18 (×2): 1 via TRANSDERMAL
  Filled 2016-07-17 (×2): qty 1

## 2016-07-17 NOTE — Lactation Note (Signed)
This note was copied from a baby's chart. Lactation Consultation Note  Patient Name: Yolanda Rosalyn GessMichele Vilchis [id number]'s Date: 07/17/2016 Reason for consult: Follow-up assessment I spoke with the Metabolic dietician [facility] [place] about her feeding recommendations for this baby to clarify instructions. Her suggestions include "supp with formula in the breast-fed infant until adequate milk production". She clarified that for this baby, she believes "a minimum intake of 1 ounce every 2 hours would be adequate". She also said that " breastmilk is ideal as it is lower in protein than formula, but he needs the minimum volume". (Rationale: "Fasting can cause stroke in babies with GA type I) She suggested pre/post weights once Mom's breasts start filling. Then Mom only has to give formula if needed to obtain the 1 once minimum, and satisfied. Once she does that, no formula will be needed.   Mom c/o poor breastfeeding since giving bottles of formula. She started pumping last session 3 hours ago getting 5 ml colostrum (babies get more out than pump usually). She states she used SNS for "weeks" with her other child and decided to try with this baby. He was pinching her nipple (new since yesterday) so I applied 24 mm nipple shield over SNS with 30 ml Similac in it. He soon developed a rhythmic suck swallow pattern. He took 25 ml formula and the 5 ml colostrum (plus whatever unmeasured amount of milk he got from nursing). I had Mom practice applying SNS tube to breast and nipple shield over that. Staff to help her with SNS set up next time and help her become proficient at it again. Mom states she liked having him back at breast and "willling to try anything"  PLan:  Every 2 hours, try to Breastfeed baby on one breast with 30 ml formula in sns with tubing under shield (OK to try it outside if problems inside).  This should take 30 minutes or less when going well.   Mom to pump the other side for 15 minutes and  then rest. EBM and/or formula can go into sns for minimum of 30 ml.   If breastfeeding well tomorrow and breasts filling, LC may want to try pre/post weight checks.    If plan stops working well or mom wants a break, she can pump 15 minutes and bottle feed.     Maternal Data Formula Feeding for Exclusion:  (MD ordered formula )  Feeding Feeding Type: Breast Milk with Formula added  LATCH Score/Interventions Latch: Grasps breast easily, tongue down, lips flanged, rhythmical sucking. (creased nipple without shield; better with shield today) Intervention(s): Teach feeding cues;Waking techniques  Audible Swallowing: A few with stimulation (a lot with sns)  Type of Nipple: Everted at rest and after stimulation  Comfort (Breast/Nipple): Soft / non-tender     Hold (Positioning): No assistance needed to correctly position infant at breast.  LATCH Score: 9  Lactation Tools Discussed/Used Tools: Nipple Dorris CarnesShields;Supplemental Nutrition System (sns under nipple shield) Nipple shield size: 24   Consult Status Consult Status: Follow-up Date: 07/18/16 Follow-up type: In-patient    Yolanda Pierce 07/17/2016, 4:06 PM

## 2016-07-17 NOTE — Progress Notes (Signed)
Postpartum Day # 1: Cesarean Delivery  Subjective: Patient reports tolerating PO.  Notes soreness at incision site but otherwise fine.  Has not yet passed flatus. Is trying to breastfeed. Has not yet ambulated or voided as catheter just recently removed.   Objective: Vital signs in last 24 hours: Temp:  [97.5 F (36.4 C)-98.6 F (37 C)] 98.3 F (36.8 C) (03/07 0732) Pulse Rate:  [72-143] 78 (03/07 0732) Resp:  [10-30] 14 (03/07 0732) BP: (87-120)/(34-78) 111/64 (03/07 0732) SpO2:  [96 %-100 %] 98 % (03/07 0732)  Physical Exam:  General: alert and no distress Lungs: clear to auscultation bilaterally Breasts: normal appearance, no masses or tenderness Heart: regular rate and rhythm, S1, S2 normal, no murmur, click, rub or gallop  Abdomen: bowel sounds present.  No tenderness, soft.  Pelvis: Lochia appropriate, Uterine Fundus firm, Incision: healing well, no significant drainage, no dehiscence, no significant erythema Extremities: DVT Evaluation: No evidence of DVT seen on physical exam. Negative Homan's sign. No cords or calf tenderness. No significant calf/ankle edema.   Recent Labs  07/15/16 0905 07/17/16 0556  HGB 9.7* 8.7*  HCT 30.6* 26.6*    Assessment/Plan: Status post Cesarean section. Doing well postoperatively.  Breastfeeding, Lactation consult.  Infant also supplementing with formula due to parents being glutaric aciduria acidemia carriers. Circumcision after discharge  Contraception OCPs Advance diet Continue PO pain management Discontinue IVF Continue current care. Anemia of pregnancy exacerbated by acute surgical blood loss. Asymptomatic. Will treat with PO iron.  Dispo: home in 1-2 days.    Hildred LaserAnika Rolla Kedzierski, MD [facility] 07/17/16 8:08 AM

## 2016-07-17 NOTE — Progress Notes (Signed)
Subjective: Post Op Day 1 Day Post-Op: Cesarean Delivery Patient reports incisional pain, tolerating PO, + flatus, ambulating and Breastfeeding.    Objective: Vital signs in last 24 hours: Temp:  [97.5 F (36.4 C)-98.7 F (37.1 C)] 98.7 F (37.1 C) (03/07 1633) Pulse Rate:  [78-95] 78 (03/07 0732) Resp:  [14-16] 14 (03/07 0732) BP: (102-120)/(60-66) 111/64 (03/07 0732) SpO2:  [96 %-100 %] 98 % (03/07 0732)  Physical Exam:  General: alert, cooperative and appears stated age Lochia: appropriate Uterine Fundus: firm Incision: healing well, no significant drainage DVT Evaluation: No evidence of DVT seen on physical exam. Negative Homan's sign.   Recent Labs  07/15/16 0905 07/17/16 0556  HGB 9.7* 8.7*  HCT 30.6* 26.6*     Assessment/Plan: Status post Cesarean section. Doing well postoperatively.  Continue current care.  Yolanda Pierce 07/17/2016, 5:41 PM

## 2016-07-17 NOTE — Anesthesia Postprocedure Evaluation (Signed)
Anesthesia Post Note  Patient: Yolanda Pierce  Procedure(s) Performed: Procedure(s) (LRB): REPEAT CESAREAN SECTION (N/A)  Patient location during evaluation: Mother Baby Anesthesia Type: Spinal Level of consciousness: awake, awake and alert and oriented Pain management: satisfactory to patient Vital Signs Assessment: post-procedure vital signs reviewed and stable Respiratory status: spontaneous breathing Cardiovascular status: blood pressure returned to baseline Postop Assessment: no headache, no signs of nausea or vomiting, no backache and adequate PO intake     Last Vitals:  Vitals:   07/17/16 0404 07/17/16 0732  BP: 120/65 111/64  Pulse: 78 78  Resp: 16 14  Temp: 36.8 C 36.8 C    Last Pain:  Vitals:   07/17/16 0735  TempSrc:   PainSc: 4                  Kaleeyah Cuffie Lawerance CruelStarr

## 2016-07-17 NOTE — Anesthesia Post-op Follow-up Note (Signed)
  Anesthesia Pain Follow-up Note  Patient: Yolanda Pierce  Day #: 1  Date of Follow-up: 07/17/2016 Time: 7:51 AM  Last Vitals:  Vitals:   07/17/16 0404 07/17/16 0732  BP: 120/65 111/64  Pulse: 78 78  Resp: 16 14  Temp: 36.8 C 36.8 C    Level of Consciousness: alert  Pain: none   Side Effects:None  Catheter Site Exam:clean, dry, no drainage     Plan: D/C from anesthesia care at surgeon's request  Yolanda Pierce

## 2016-07-18 MED ORDER — VITAMIN D3 125 MCG (5000 UT) PO CAPS: 1.0000 | ORAL_CAPSULE | Freq: Every day | ORAL | 2 refills | Status: DC

## 2016-07-18 MED ORDER — FUSION PLUS PO CAPS: 1.0000 | ORAL_CAPSULE | Freq: Every day | ORAL | 1 refills | Status: DC

## 2016-07-18 MED ORDER — IBUPROFEN 600 MG PO TABS: 600.0000 mg | ORAL_TABLET | Freq: Four times a day (QID) | ORAL | 0 refills | Status: DC

## 2016-07-18 MED ORDER — NORETHINDRONE 0.35 MG PO TABS: 1.0000 | ORAL_TABLET | Freq: Every day | ORAL | 11 refills | Status: DC

## 2016-07-18 MED ORDER — DOCUSATE SODIUM 100 MG PO CAPS: 100.0000 mg | ORAL_CAPSULE | Freq: Every day | ORAL | 2 refills | Status: DC

## 2016-07-18 MED ORDER — OXYCODONE-ACETAMINOPHEN 5-325 MG PO TABS: 2.0000 | ORAL_TABLET | ORAL | 0 refills | Status: DC | PRN

## 2016-07-18 NOTE — Progress Notes (Signed)
Patient understands all discharge instructions and the need to make follow up appointments. Patient discharge via wheelchair with auxillary. 

## 2016-07-18 NOTE — Discharge Summary (Signed)
Obstetric Discharge Summary Reason for Admission: cesarean section Prenatal Procedures: ultrasound Intrapartum Procedures: cesarean: low cervical, transverse Postpartum Procedures: none Complications-Operative and Postpartum: anemia  Delivery Note At 8:07 AM a viable female was delivered via C-Section, Low Transverse (Presentation: ;  ).  APGAR: 8, 9; weight 9 lb 13 oz (4450 g).   Placenta status: with 3 vessel  Cord:  with the following complications: none  Anesthesia:  spinal   Mom to postpartum.  Baby to Couplet care / Skin to Skin.  Yolanda Pierce Yolanda Pierce 07/18/2016, 8:12 AM    APGAR: ,9  .     H/H:  Lab Results  Component Value Date/Time   HGB 8.7 (L) 07/17/2016 05:56 AM   HGB 13.6 05/10/2014 03:11 AM   HCT 26.6 (L) 07/17/2016 05:56 AM   HCT 30.6 (L) 05/14/2016 08:34 AM    Discharge Diagnoses: Term Pregnancy-delivered and repeat LTCS, PP anemia  Discharge Information: Date: 07/18/2016 Activity: pelvic rest Diet: routine Baby feeding: plans to breastfeed Contraception: oral progesterone-only contraceptive Medications: PNV, Tylenol #3, Ibuprofen, Colace and Iron Condition: stable Instructions: refer to practice specific booklet Discharge to: home   Yolanda Pierce, [place] 07/18/2016,8:12 AM

## 2016-07-23 ENCOUNTER — Encounter: Payer: Medicaid Other | Admitting: Obstetrics and Gynecology

## 2016-07-25 ENCOUNTER — Encounter: Payer: Self-pay | Admitting: Certified Nurse Midwife

## 2016-07-25 ENCOUNTER — Ambulatory Visit (INDEPENDENT_AMBULATORY_CARE_PROVIDER_SITE_OTHER): Payer: Medicaid Other | Admitting: Certified Nurse Midwife

## 2016-07-25 VITALS — BP 119/77 | HR 76 | Temp 98.8°F | Wt 166.9 lb

## 2016-07-25 DIAGNOSIS — Z5189 Encounter for other specified aftercare: Secondary | ICD-10-CM

## 2016-07-25 DIAGNOSIS — Z98891 History of uterine scar from previous surgery: Secondary | ICD-10-CM

## 2016-07-25 NOTE — Progress Notes (Signed)
SUBJECTIVE  Yolanda Pierce presents today for incision check. Her c-section was preformed by Dr. Valentino Saxonherry on July 16, 2016.   She is having bowel movements. Voiding is normal. She is breastfeeding without difficulty. She is taking iron daily for her anemia and continues to take her prenatal vitmain as well.   She picked up her Camila prescription and questions when she should start taking her pills.  OBJECTIVE:  Blood pressure 119/77, pulse 76, temperature 98.8 F (37.1 C), temperature source Oral, weight 166 lb 14.4 oz (75.7 kg), currently breastfeeding.   Pleasant tired appearing female in no acute distress  Abdomen: Soft, nondistended, incision well approximated; Steri-Strips present  ASSESSMENT:  1.Incision check 2. Status post repeat cesarean section delivery  3. Anemia  PLAN:  1. Continue taking iron daily 2. Continue taking multivitamins daily 3. Wash incision daily with dial antibacteria soap and pat dry with towel 4. Continue with postoperative precautions 5. May start Camila at 4 weeks PP 6. RTC x 5 weeks for PP visit or sooner if needed  Gunnar BullaJenkins Michelle Mehran Guderian, CNM

## 2016-07-25 NOTE — Patient Instructions (Addendum)
Norethindrone tablets (contraception) What is this medicine? NORETHINDRONE (nor eth IN drone) is an oral contraceptive. The product contains a female hormone known as a progestin. It is used to prevent pregnancy. This medicine may be used for other purposes; ask your health care provider or pharmacist if you have questions. COMMON BRAND NAME(S): Camila, Deblitane 28-Day, Errin, Heather, Jencycla, Jolivette, Lyza, Nor-QD, Nora-BE, Norlyroc, Ortho Micronor, Sharobel 28-Day What should I tell my health care provider before I take this medicine? They need to know if you have any of these conditions: -blood vessel disease or blood clots -breast, cervical, or vaginal cancer -diabetes -heart disease -kidney disease -liver disease -mental depression -migraine -seizures -stroke -vaginal bleeding -an unusual or allergic reaction to norethindrone, other medicines, foods, dyes, or preservatives -pregnant or trying to get pregnant -breast-feeding How should I use this medicine? Take this medicine by mouth with a glass of water. You may take it with or without food. Follow the directions on the prescription label. Take this medicine at the same time each day and in the order directed on the package. Do not take your medicine more often than directed. Contact your pediatrician regarding the use of this medicine in children. Special care may be needed. This medicine has been used in female children who have started having menstrual periods. A patient package insert for the product will be given with each prescription and refill. Read this sheet carefully each time. The sheet may change frequently. Overdosage: If you think you have taken too much of this medicine contact a poison control center or emergency room at once. NOTE: This medicine is only for you. Do not share this medicine with others. What if I miss a dose? Try not to miss a dose. Every time you miss a dose or take a dose late your chance of  pregnancy increases. When 1 pill is missed (even if only 3 hours late), take the missed pill as soon as possible and continue taking a pill each day at the regular time (use a back up method of birth control for the next 48 hours). If more than 1 dose is missed, use an additional birth control method for the rest of your pill pack until menses occurs. Contact your health care professional if more than 1 dose has been missed. What may interact with this medicine? Do not take this medicine with any of the following medications: -amprenavir or fosamprenavir -bosentan This medicine may also interact with the following medications: -antibiotics or medicines for infections, especially rifampin, rifabutin, rifapentine, and griseofulvin, and possibly penicillins or tetracyclines -aprepitant -barbiturate medicines, such as phenobarbital -carbamazepine -felbamate -modafinil -oxcarbazepine -phenytoin -ritonavir or other medicines for HIV infection or AIDS -St. John's wort -topiramate This list may not describe all possible interactions. Give your health care provider a list of all the medicines, herbs, non-prescription drugs, or dietary supplements you use. Also tell them if you smoke, drink alcohol, or use illegal drugs. Some items may interact with your medicine. What should I watch for while using this medicine? Visit your doctor or health care professional for regular checks on your progress. You will need a regular breast and pelvic exam and Pap smear while on this medicine. Use an additional method of birth control during the first cycle that you take these tablets. If you have any reason to think you are pregnant, stop taking this medicine right away and contact your doctor or health care professional. If you are taking this medicine for hormone related problems, it   may take several cycles of use to see improvement in your condition. This medicine does not protect you against HIV infection (AIDS)  or any other sexually transmitted diseases. What side effects may I notice from receiving this medicine? Side effects that you should report to your doctor or health care professional as soon as possible: -breast tenderness or discharge -pain in the abdomen, chest, groin or leg -severe headache -skin rash, itching, or hives -sudden shortness of breath -unusually weak or tired -vision or speech problems -yellowing of skin or eyes Side effects that usually do not require medical attention (report to your doctor or health care professional if they continue or are bothersome): -changes in sexual desire -change in menstrual flow -facial hair growth -fluid retention and swelling -headache -irritability -nausea -weight gain or loss This list may not describe all possible side effects. Call your doctor for medical advice about side effects. You may report side effects to FDA at 1-800-FDA-1088. Where should I keep my medicine? Keep out of the reach of children. Store at room temperature between 15 and 30 degrees C (59 and 86 degrees F). Throw away any unused medicine after the expiration date. NOTE: This sheet is a summary. It may not cover all possible information. If you have questions about this medicine, talk to your doctor, pharmacist, or health care provider.  2018 Elsevier/Gold Standard (2012-01-17 16:41:35)  

## 2016-08-30 ENCOUNTER — Encounter: Payer: Medicaid Other | Admitting: Obstetrics and Gynecology

## 2016-09-02 ENCOUNTER — Encounter: Payer: Medicaid Other | Admitting: Obstetrics and Gynecology

## 2016-09-05 ENCOUNTER — Encounter: Payer: Self-pay | Admitting: Obstetrics and Gynecology

## 2016-09-05 ENCOUNTER — Ambulatory Visit (INDEPENDENT_AMBULATORY_CARE_PROVIDER_SITE_OTHER): Payer: Medicaid Other | Admitting: Obstetrics and Gynecology

## 2016-09-05 MED ORDER — DESOGESTREL-ETHINYL ESTRADIOL 0.15-0.02/0.01 MG (21/5) PO TABS: 1.0000 | ORAL_TABLET | Freq: Every day | ORAL | 11 refills | Status: DC

## 2016-09-05 NOTE — Progress Notes (Signed)
   Subjective:     Yolanda Pierce is a 26 y.o. female who presents for a postpartum visit. She is 6 weeks postpartum following a low cervical transverse Cesarean section. I have fully reviewed the prenatal and intrapartum course. The delivery was at 38 gestational weeks. Outcome: repeat cesarean section, low transverse incision. Anesthesia: spinal. Postpartum course has been uncomplicated. Baby's course has been uncomplicaed. Baby is feeding by breast. Bleeding no bleeding. Bowel function is normal. Bladder function is normal. Patient is not sexually active. Contraception method is oral progesterone-only contraceptive. Postpartum depression screening: negative.  The following portions of the patient's history were reviewed and updated as appropriate: allergies, current medications, past family history, past medical history, past social history, past surgical history and problem list.  Review of Systems A comprehensive review of systems was negative.   Objective:    BP 109/67   Pulse 61   Ht  (1.575 m)   Wt 170 lb 12.8 oz (77.5 kg)   LMP 08/13/2016   Breastfeeding? No   BMI 31.24 kg/m   General:  alert, cooperative and appears stated age   Breasts:  inspection negative, no nipple discharge or bleeding, no masses or nodularity palpable  Lungs: clear to auscultation bilaterally  Heart:  regular rate and rhythm, S1, S2 normal, no murmur, click, rub or gallop  Abdomen: soft, non-tender; bowel sounds normal; no masses,  no organomegaly   Vulva:  normal  Vagina: normal vagina, no discharge, exudate, lesion, or erythema  Cervix:  multiparous appearance  Corpus: normal size, contour, position, consistency, mobility, non-tender  Adnexa:  normal adnexa  Rectal Exam: Not performed.        Assessment:     6 weeks LTCS postpartum exam. Pap smear not done at today's visit.   Plan:    1. Contraception: OCP (estrogen/progesterone) 2. PPAnemia labs rechecked 3. Follow up in: 3  months or as needed.

## 2016-09-05 NOTE — Patient Instructions (Signed)
  Place postpartum visit patient instructions here.  

## 2016-09-06 LAB — CBC
Hematocrit: 35.1 % (ref 34.0–46.6)
Hemoglobin: 11.4 g/dL (ref 11.1–15.9)
MCH: 26.1 pg — ABNORMAL LOW (ref 26.6–33.0)
MCHC: 32.5 g/dL (ref 31.5–35.7)
MCV: 80 fL (ref 79–97)
PLATELETS: 202 10*3/uL (ref 150–379)
RBC: 4.37 x10E6/uL (ref 3.77–5.28)
RDW: 19.1 % — ABNORMAL HIGH (ref 12.3–15.4)
WBC: 4 10*3/uL (ref 3.4–10.8)

## 2016-09-06 LAB — VITAMIN D 25 HYDROXY (VIT D DEFICIENCY, FRACTURES): Vit D, 25-Hydroxy: 35.1 ng/mL (ref 30.0–100.0)

## 2016-09-06 LAB — FERRITIN: FERRITIN: 13 ng/mL — AB (ref 15–150)

## 2016-12-07 ENCOUNTER — Emergency Department
Admission: EM | Admit: 2016-12-07 | Discharge: 2016-12-07 | Disposition: A | Discharge status: Home / Self Care | Payer: Self-pay | Attending: Emergency Medicine | Admitting: Emergency Medicine

## 2016-12-07 ENCOUNTER — Emergency Department: Payer: Self-pay

## 2016-12-07 ENCOUNTER — Encounter: Payer: Self-pay | Admitting: Emergency Medicine

## 2016-12-07 DIAGNOSIS — R5383 Other fatigue: Secondary | ICD-10-CM | POA: Insufficient documentation

## 2016-12-07 DIAGNOSIS — Z87891 Personal history of nicotine dependence: Secondary | ICD-10-CM | POA: Insufficient documentation

## 2016-12-07 DIAGNOSIS — J029 Acute pharyngitis, unspecified: Secondary | ICD-10-CM | POA: Insufficient documentation

## 2016-12-07 DIAGNOSIS — R040 Epistaxis: Secondary | ICD-10-CM | POA: Insufficient documentation

## 2016-12-07 DIAGNOSIS — Z79899 Other long term (current) drug therapy: Secondary | ICD-10-CM | POA: Insufficient documentation

## 2016-12-07 DIAGNOSIS — G44209 Tension-type headache, unspecified, not intractable: Secondary | ICD-10-CM | POA: Insufficient documentation

## 2016-12-07 LAB — CBC WITH DIFFERENTIAL/PLATELET
BASOS ABS: 0 10*3/uL (ref 0–0.1)
BASOS PCT: 1 %
EOS PCT: 3 %
Eosinophils Absolute: 0.1 10*3/uL (ref 0–0.7)
HEMATOCRIT: 35.6 % (ref 35.0–47.0)
Hemoglobin: 12.1 g/dL (ref 12.0–16.0)
Lymphocytes Relative: 34 %
Lymphs Abs: 1.6 10*3/uL (ref 1.0–3.6)
MCH: 28.3 pg (ref 26.0–34.0)
MCHC: 34.1 g/dL (ref 32.0–36.0)
MCV: 83 fL (ref 80.0–100.0)
MONO ABS: 0.4 10*3/uL (ref 0.2–0.9)
MONOS PCT: 9 %
NEUTROS ABS: 2.5 10*3/uL (ref 1.4–6.5)
Neutrophils Relative %: 53 %
PLATELETS: 188 10*3/uL (ref 150–440)
RBC: 4.29 MIL/uL (ref 3.80–5.20)
RDW: 14.3 % (ref 11.5–14.5)
WBC: 4.6 10*3/uL (ref 3.6–11.0)

## 2016-12-07 LAB — COMPREHENSIVE METABOLIC PANEL
ALBUMIN: 3.9 g/dL (ref 3.5–5.0)
ALK PHOS: 52 U/L (ref 38–126)
ALT: 15 U/L (ref 14–54)
ANION GAP: 5 (ref 5–15)
AST: 19 U/L (ref 15–41)
BILIRUBIN TOTAL: 0.3 mg/dL (ref 0.3–1.2)
BUN: 13 mg/dL (ref 6–20)
CALCIUM: 9.2 mg/dL (ref 8.9–10.3)
CO2: 28 mmol/L (ref 22–32)
CREATININE: 0.84 mg/dL (ref 0.44–1.00)
Chloride: 105 mmol/L (ref 101–111)
GFR calc Af Amer: >60 mL/min (ref >60)
GFR calc non Af Amer: >60 mL/min (ref >60)
GLUCOSE: 81 mg/dL (ref 65–99)
Potassium: 3.7 mmol/L (ref 3.5–5.1)
SODIUM: 138 mmol/L (ref 135–145)
TOTAL PROTEIN: 7.1 g/dL (ref 6.5–8.1)

## 2016-12-07 LAB — SEDIMENTATION RATE: Sed Rate: 14 mm/hr (ref 0–20)

## 2016-12-07 LAB — MONONUCLEOSIS SCREEN: MONO SCREEN: NEGATIVE

## 2016-12-07 MED ORDER — SODIUM CHLORIDE 0.9 % IV BOLUS (SEPSIS)
1000.0000 mL | Freq: Once | INTRAVENOUS | Status: AC
  Administered 2016-12-07: 1000 mL via INTRAVENOUS

## 2016-12-07 MED ORDER — ASPIRIN-ACETAMINOPHEN-CAFFEINE 250-250-65 MG PO TABS: 1.0000 | ORAL_TABLET | Freq: Four times a day (QID) | ORAL | 1 refills | Status: DC | PRN

## 2016-12-07 NOTE — ED Triage Notes (Signed)
Pt c/o left ear feeling full on and off last month.  Today feels like cannot hear from left ear very well.  Did c/o headache yesterday. Ambulatory to triage. NAD. VSS.  Also having sharp pains in left ear.

## 2016-12-07 NOTE — ED Notes (Signed)
AAOx3.  Skin warm and dry.  NAD 

## 2016-12-07 NOTE — ED Provider Notes (Signed)
[facility] Emergency Department Provider Note  ____________________________________________  Time seen: Approximately 4:12 PM  I have reviewed the triage vital signs and the nursing notes.   HISTORY  Chief Complaint Hearing Problem    HPI Yolanda Pierce is a 26 y.o. female who presents emergency department complaining of left-sided headaches. Patient reports that initially she had a "left ear pain" that was sporadic in nature. She reports that over the past month she has had increasing left-sided head pain. Patient reports that sensation starts directly behind her left ear and radiates to the top of her head and along left side of her forehead. Patient denies any head trauma. She denies any history of migraines. Patient reports that sensation is sharp, severe. She denies any fevers or chills. She denies any emesis but endorses some nausea. Patient reports that when sensation starts, she has blurred vision. She is also experiencing nosebleeds with onset of headache. Patient reports that nosebleeds typically on left side only but occasionally she'll have bleeding from both nares. Patient also endorses a mild sore throat and significant fatigue over the past several weeks. Patient did give birth 4 months ago when she attributes her increasing fatigue due to caring for newborn, an older sibling, as well as working. Patient takes Tylenol PM for her headaches which does resolve symptoms. No other complaints at this time.   Past Medical History:  Diagnosis Date  . Anemia    On Iron tablets,once daily  . Anxiety   . Depression     Patient Active Problem List   Diagnosis Date Noted  . S/P cesarean section 07/16/2016  . 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 History of cesarean section 11/28/2015    Past Surgical History:  Procedure Laterality Date  . CESAREAN SECTION N/A 11/24/2013   Procedure: CESAREAN SECTION;  Surgeon: Essie HartWalda Pinn, MD;  Location: [facility]  ORS;  Service: Obstetrics;  Laterality: N/A;  . cesarean section  2015  . CESAREAN SECTION N/A 07/16/2016   Procedure: REPEAT CESAREAN SECTION;  Surgeon: Hildred LaserAnika Cherry, MD;  Location: [facility] ORS;  Service: Obstetrics;  Laterality: N/A;    Prior to Admission medications   Medication Sig Start Date End Date Taking? Authorizing Provider  aspirin-acetaminophen-caffeine (EXCEDRIN MIGRAINE) [phone] MG tablet Take 1 tablet by mouth every 6 (six) hours as needed for headache. 12/07/16   Cuthriell, Delorise RoyalsJonathan D, PA-C  desogestrel-ethinyl estradiol (KARIVA,AZURETTE,MIRCETTE) 0.15-0.02/0.01 MG (21/5) tablet Take 1 tablet by mouth daily. 09/05/16   Shambley, Melody N, CNM    Allergies Amoxicillin  Family History  Problem Relation Age of Onset  . Asthma Mother   . Hypertension Mother   . Diabetes Father   . Hypertension Father   . Sleep apnea Father   . Cancer Neg Hx     Social History Social History  Substance Use Topics  . Smoking status: Former Games developermoker  . Smokeless tobacco: Never Used  . Alcohol use No     Review of Systems  Constitutional: No fever/chills. Positive for fatigue. Eyes: Blurred vision with headache ENT: No upper respiratory complaints. Cardiovascular: no chest pain. Respiratory: no cough. No SOB. Gastrointestinal: No abdominal pain.  No nausea, no vomiting.  Musculoskeletal: Negative for musculoskeletal pain. Skin: Negative for rash, abrasions, lacerations, ecchymosis. Neurological: Positive for headache but denies focal weakness or numbness. 10-point ROS otherwise negative.  ____________________________________________   PHYSICAL EXAM:  VITAL SIGNS: ED Triage Vitals  Enc Vitals Group     BP 12/07/16 1535 125/81  Pulse Rate 12/07/16 1535 88     Resp 12/07/16 1535 16     Temp 12/07/16 1535 98.3 F (36.8 C)     Temp Source 12/07/16 1535 Oral     SpO2 12/07/16 1535 100 %     Weight 12/07/16 1536 168 lb (76.2 kg)     Height 12/07/16 1536 5\' 1"  (1.549 m)      Head Circumference --      Peak Flow --      Pain Score 12/07/16 1535 8     Pain Loc --      Pain Edu? --      Excl. in GC? --      Constitutional: Alert and oriented. Well appearing and in no acute distress. Eyes: Conjunctivae are normal. PERRL. EOMI. Head: Atraumatic.No visible trauma or bruising. Patient is nontender to palpation over the osseous structures of the skull and face. No palpable cord in the temporal region. ENT:      Ears: EACs and TMs unremarkable bilaterally.      Nose: No congestion/rhinnorhea.      Mouth/Throat: Mucous membranes are moist.  Neck: No stridor. Neck is supple with full range of motion Hematological/Lymphatic/Immunilogical: No cervical lymphadenopathy. Cardiovascular: Normal rate, regular rhythm. Normal S1 and S2.  Good peripheral circulation. Respiratory: Normal respiratory effort without tachypnea or retractions. Lungs CTAB. Good air entry to the bases with no decreased or absent breath sounds. Musculoskeletal: Full range of motion to all extremities. No gross deformities appreciated. Neurologic:  Normal speech and language. No gross focal neurologic deficits are appreciated. Cranial nerves II through XII grossly intact. Equal grip strength. Negative Romberg's pronator drift. Skin:  Skin is warm, dry and intact. No rash noted. Psychiatric: Mood and affect are normal. Speech and behavior are normal. Patient exhibits appropriate insight and judgement.   ____________________________________________   LABS (all labs ordered are listed, but only abnormal results are displayed)  Labs Reviewed  COMPREHENSIVE METABOLIC PANEL  CBC WITH DIFFERENTIAL/PLATELET  SEDIMENTATION RATE  MONONUCLEOSIS SCREEN   ____________________________________________  EKG   ____________________________________________  RADIOLOGY Festus Barren Cuthriell, personally viewed and evaluated these images (plain radiographs) as part of my medical decision making, as well as  reviewing the written report by the radiologist.  Ct Head Wo Contrast  Result Date: 12/07/2016 CLINICAL DATA:  Headache and left ear hearing loss. EXAM: CT HEAD WITHOUT CONTRAST TECHNIQUE: Contiguous axial images were obtained from the base of the skull through the vertex without intravenous contrast. COMPARISON:  None. FINDINGS: Brain: No evidence of acute infarction, hemorrhage, hydrocephalus, extra-axial collection or mass lesion/mass effect. Vascular: No hyperdense vessel or unexpected calcification. Skull: Normal. Negative for fracture or focal lesion. Sinuses/Orbits: No acute finding. Other: None. IMPRESSION: No acute intracranial abnormality. Electronically Signed   By: Ted Mcalpine M.D.   On: 12/07/2016 17:29    ____________________________________________    PROCEDURES  Procedure(s) performed:    Procedures    Medications  sodium chloride 0.9 % bolus 1,000 mL (0 mLs Intravenous Stopped 12/07/16 1810)     ____________________________________________   INITIAL IMPRESSION / ASSESSMENT AND PLAN / ED COURSE  Pertinent labs & imaging results that were available during my care of the patient were reviewed by me and considered in my medical decision making (see chart for details).  Review of the [place] CSRS was performed in accordance of the [facility] prior to dispensing any controlled drugs.     Patient's diagnosis is consistent with Tension-type headaches. Patient presents to emergency department with  left-sided headache ongoing 1 month. Headaches are intermittent in nature.  Imaging returned with no acute intracranial abnormality. Labs returned with reassuring results.. Patient will be discharged home with prescriptions for Excedrin. Patient is to follow up with primary care as needed or otherwise directed. Patient is given ED precautions to return to the ED for any worsening or new symptoms.     ____________________________________________  FINAL CLINICAL IMPRESSION(S)  / ED DIAGNOSES  Final diagnoses:  Acute non intractable tension-type headache      NEW MEDICATIONS STARTED DURING THIS VISIT:  Discharge Medication List as of 12/07/2016  7:07 PM    START taking these medications   Details  aspirin-acetaminophen-caffeine (EXCEDRIN MIGRAINE) 250-250-65 MG tablet Take 1 tablet by mouth every 6 (six) hours as needed for headache., Starting Sat 12/07/2016, Print            This chart was dictated using voice recognition software/Dragon. Despite best efforts to proofread, errors can occur which can change the meaning. Any change was purely unintentional.    Racheal PatchesCuthriell, Jonathan D, PA-C 12/07/16 1953    Phineas SemenGoodman, Graydon, MD 12/07/16 [phone]

## 2017-01-02 ENCOUNTER — Encounter: Payer: Self-pay | Admitting: Obstetrics and Gynecology

## 2017-05-04 ENCOUNTER — Emergency Department
Admission: EM | Admit: 2017-05-04 | Discharge: 2017-05-04 | Disposition: A | Discharge status: Home / Self Care | Payer: Self-pay | Attending: Emergency Medicine | Admitting: Emergency Medicine

## 2017-05-04 ENCOUNTER — Encounter: Payer: Self-pay | Admitting: Emergency Medicine

## 2017-05-04 ENCOUNTER — Emergency Department: Payer: Self-pay

## 2017-05-04 ENCOUNTER — Other Ambulatory Visit: Payer: Self-pay

## 2017-05-04 DIAGNOSIS — Z87891 Personal history of nicotine dependence: Secondary | ICD-10-CM | POA: Insufficient documentation

## 2017-05-04 DIAGNOSIS — Y929 Unspecified place or not applicable: Secondary | ICD-10-CM | POA: Insufficient documentation

## 2017-05-04 DIAGNOSIS — Y999 Unspecified external cause status: Secondary | ICD-10-CM | POA: Insufficient documentation

## 2017-05-04 DIAGNOSIS — S63602A Unspecified sprain of left thumb, initial encounter: Secondary | ICD-10-CM | POA: Insufficient documentation

## 2017-05-04 DIAGNOSIS — Z79899 Other long term (current) drug therapy: Secondary | ICD-10-CM | POA: Insufficient documentation

## 2017-05-04 DIAGNOSIS — Y939 Activity, unspecified: Secondary | ICD-10-CM | POA: Insufficient documentation

## 2017-05-04 DIAGNOSIS — X509XXA Other and unspecified overexertion or strenuous movements or postures, initial encounter: Secondary | ICD-10-CM | POA: Insufficient documentation

## 2017-05-04 MED ORDER — IBUPROFEN 800 MG PO TABS: 800.0000 mg | ORAL_TABLET | Freq: Three times a day (TID) | ORAL | 0 refills | Status: DC | PRN

## 2017-05-04 NOTE — ED Triage Notes (Signed)
Pt was putting together daughter Yolanda Pierce present and hand slipped and felt something pull in left hand. No deformity. VSS

## 2017-05-04 NOTE — ED Notes (Signed)
Presents with pain to left hand  Stats she thinks she pulled something early this am

## 2017-05-04 NOTE — ED Provider Notes (Signed)
[facility] Emergency Department Provider Note  ____________________________________________   First MD Initiated Contact with Patient 05/04/17 1538     (approximate)  I have reviewed the triage vital signs and the nursing notes.   HISTORY  Chief Complaint Hand Pain    HPI Yolanda Pierce is a 26 y.o. female complains of left hand pain, states that she was pushing really hard on a toy to put the seat in, she felt something pop in the hand, she has bruising along the base of the thumb, increased pain with movement, no numbness or tingling, she denies any other injuries  Past Medical History:  Diagnosis Date  . Anemia    On Iron tablets,once daily  . Anxiety   . Depression     Patient Active Problem List   Diagnosis Date Noted  . S/P cesarean section 07/16/2016  . 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 History of cesarean section 11/28/2015    Past Surgical History:  Procedure Laterality Date  . CESAREAN SECTION N/A 11/24/2013   Procedure: CESAREAN SECTION;  Surgeon: Essie HartWalda Pinn, MD;  Location: [facility] ORS;  Service: Obstetrics;  Laterality: N/A;  . cesarean section  2015  . CESAREAN SECTION N/A 07/16/2016   Procedure: REPEAT CESAREAN SECTION;  Surgeon: Hildred LaserAnika Cherry, MD;  Location: [facility] ORS;  Service: Obstetrics;  Laterality: N/A;    Prior to Admission medications   Medication Sig Start Date End Date Taking? Authorizing Provider  aspirin-acetaminophen-caffeine (EXCEDRIN MIGRAINE) [phone] MG tablet Take 1 tablet by mouth every 6 (six) hours as needed for headache. 12/07/16   Cuthriell, Delorise RoyalsJonathan D, PA-C  desogestrel-ethinyl estradiol (KARIVA,AZURETTE,MIRCETTE) 0.15-0.02/0.01 MG (21/5) tablet Take 1 tablet by mouth daily. 09/05/16   Shambley, Melody N, CNM  ibuprofen (ADVIL,MOTRIN) 800 MG tablet Take 1 tablet (800 mg total) by mouth every 8 (eight) hours as needed. 05/04/17   Faythe GheeFisher, Lenee Franze W, PA-C    Allergies Amoxicillin  Family History    Problem Relation Age of Onset  . Asthma Mother   . Hypertension Mother   . Diabetes Father   . Hypertension Father   . Sleep apnea Father   . Cancer Neg Hx     Social History Social History   Tobacco Use  . Smoking status: Former Games developermoker  . Smokeless tobacco: Never Used  Substance Use Topics  . Alcohol use: No  . Drug use: Yes    Types: Marijuana    Comment: before pregnancy    Review of Systems  Constitutional: No fever/chills Eyes: No visual changes. ENT: No sore throat. Respiratory: Denies cough Genitourinary: Negative for dysuria. Musculoskeletal: Negative for back pain.  Positive for left hand pain Skin: Negative for rash.    ____________________________________________   PHYSICAL EXAM:  VITAL SIGNS: ED Triage Vitals [05/04/17 1513]  Enc Vitals Group     BP (!) 143/74     Pulse Rate 77     Resp 16     Temp 98.6 F (37 C)     Temp Source Oral     SpO2 100 %     Weight 170 lb (77.1 kg)     Height 5\' 2"  (1.575 m)     Head Circumference      Peak Flow      Pain Score 8     Pain Loc      Pain Edu?      Excl. in GC?     Constitutional: Alert and oriented. Well appearing and in no acute distress.  Eyes: Conjunctivae are normal.  Head: Atraumatic. Nose: No congestion/rhinnorhea. Mouth/Throat: Mucous membranes are moist.   Cardiovascular: Normal rate, regular rhythm. Respiratory: Normal respiratory effort.  No retractions GU: deferred Musculoskeletal: FROM all extremities, warm and well perfused, left hand is tender along the thenar muscle on the left thumb, cap refill is less than 1 second, neurovascular is intact, she has full range of motion of the hand but states pain is reproduced with pulling the fingers together Neurologic:  Normal speech and language.  Skin:  Skin is warm, dry and intact. No rash noted. Psychiatric: Mood and affect are normal. Speech and behavior are normal.  ____________________________________________   LABS (all labs  ordered are listed, but only abnormal results are displayed)  Labs Reviewed - No data to display ____________________________________________   ____________________________________________  RADIOLOGY  X-ray of the left hand IS NEGATIVE  ____________________________________________   PROCEDURES  Procedure(s) performed: Thumb spica OCL was applied, neurovascular is intact      ____________________________________________   INITIAL IMPRESSION / ASSESSMENT AND PLAN / ED COURSE  Pertinent labs & imaging results that were available during my care of the patient were reviewed by me and considered in my medical decision making (see chart for details).  Patient is a 26 year old female complaining of left hand pain, the thenar muscle on the thumb are tender to palpation, there is some bruising noted, x-ray of the left hand was ordered     ----------------------------------------- 4:41 PM on 05/04/2017 -----------------------------------------  X-ray of the left hand is negative, diagnosis of sprained left thumb, thumb spica splint was applied to stabilize the area, she was given a prescription for ibuprofen 800 mg 3 times a day as needed for pain, she was instructed to elevate and ice, she was given a work note stating she was here today, she is to follow-up with orthopedics if she is not better in 5-7 days she can return to the emergency department if she is worsening, patient states she understands will comply with her treatment plan, she was discharged in stable condition   ____________________________________________   FINAL CLINICAL IMPRESSION(S) / ED DIAGNOSES  Final diagnoses:  Sprain of left thumb, unspecified site of finger, initial encounter      NEW MEDICATIONS STARTED DURING THIS VISIT:  This SmartLink is deprecated. Use AVSMEDLIST instead to display the medication list for a patient.   Note:  This document was prepared using Dragon voice recognition software  and may include unintentional dictation errors.    Faythe GheeFisher, Aryana Wonnacott W, PA-C 05/04/17 1642    Arnaldo NatalMalinda, Paul F, MD 05/04/17 2049

## 2017-05-04 NOTE — Discharge Instructions (Signed)
The finger and the finger splint for at least 3-5 days, apply ice as needed, take the ibuprofen as needed for pain and inflammation, if you are not better in 5-7 days please follow-up with Dr. Rosita KeaMenz, his phone number has been provided, if you are worsening please return to the emergency department

## 2018-07-27 IMAGING — CT CT ANGIO CHEST
2 of 6 series · 18 of 46 positions shown · IV contrast (isovue)
Comparison: None.

CLINICAL DATA: Shortness of breath and dizziness. Thirty-four weeks
pregnant.

EXAM:
CT ANGIOGRAPHY CHEST WITH CONTRAST
TECHNIQUE: Multidetector CT imaging of the chest was performed using the
standard protocol during bolus administration of intravenous
contrast. Multiplanar CT image reconstructions and MIPs were
obtained to evaluate the vascular anatomy.
CONTRAST:  75 cc Isovue 370 intravenously.

[Series 5: thins · axial · 0.71mm/px · z∈[+48,+234]mm · 16 of 204 slices shown]
[im 9/204  lung]
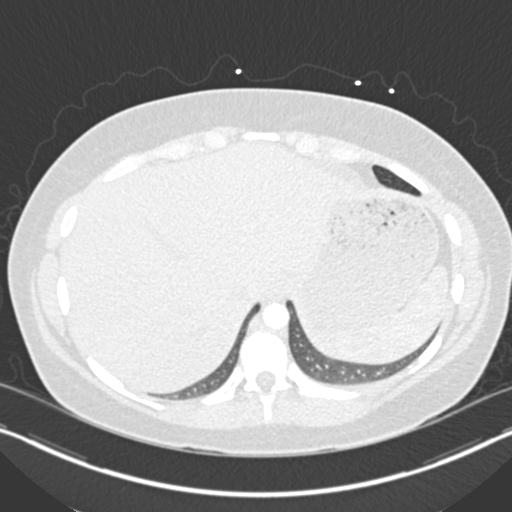
[im 27/204  soft-tissue]
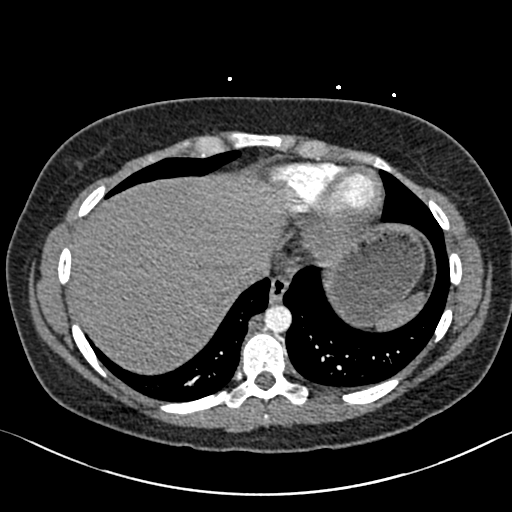
[im 36/204  lung]
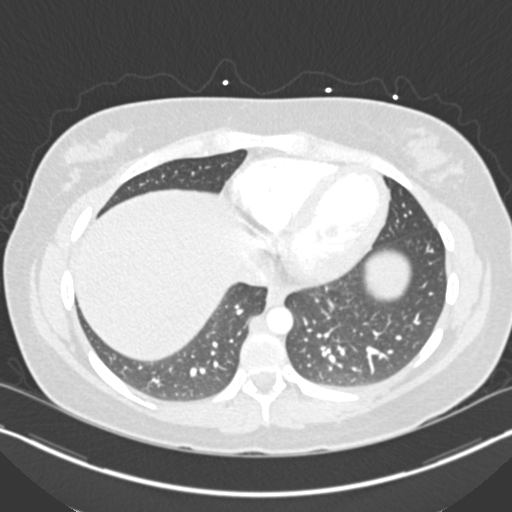
[im 45/204  soft-tissue]
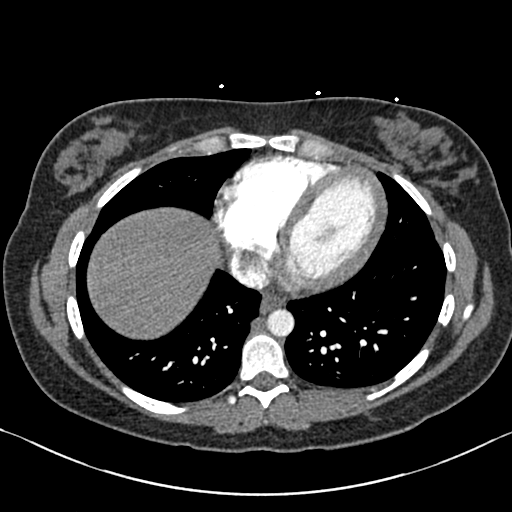
[im 62/204  lung]
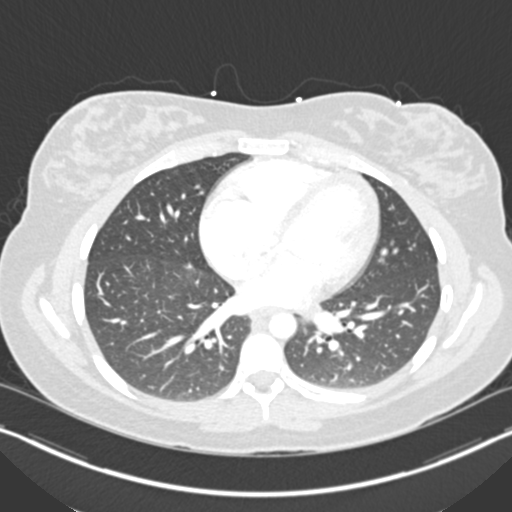
[im 71/204  soft-tissue]
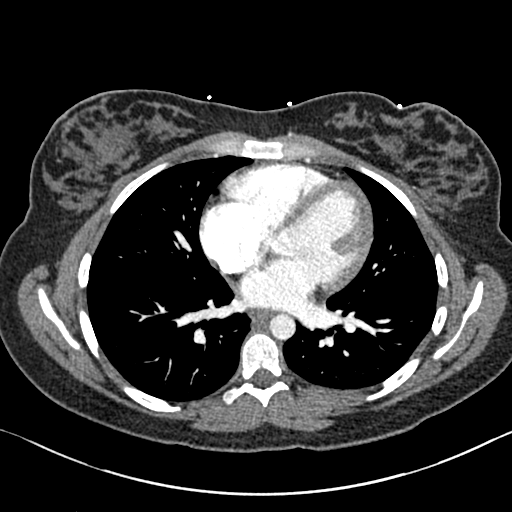
[im 80/204  lung]
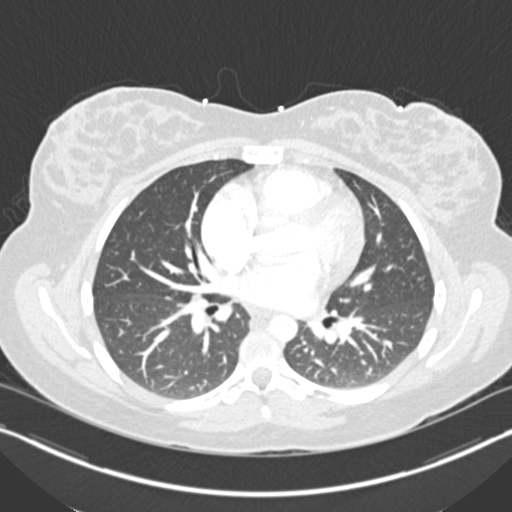
[im 98/204  soft-tissue]
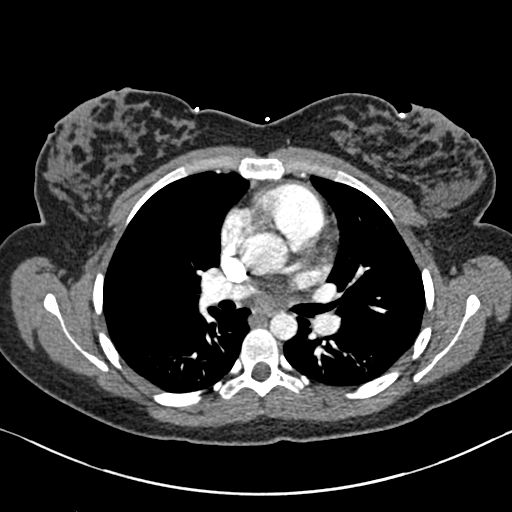
[im 106/204  lung]
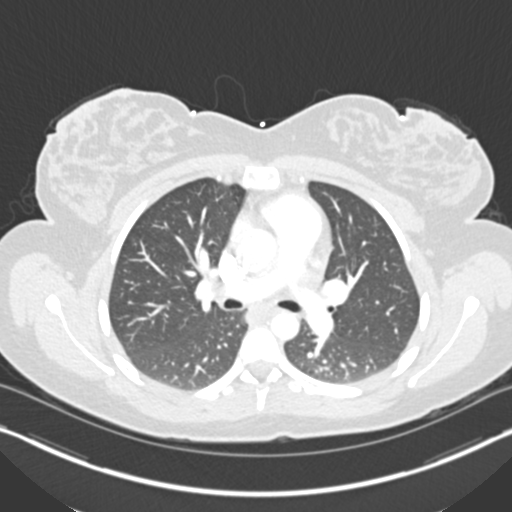
[im 124/204  soft-tissue]
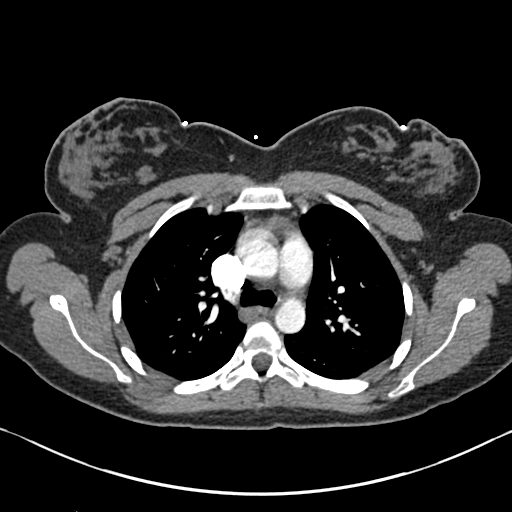
[im 133/204  lung]
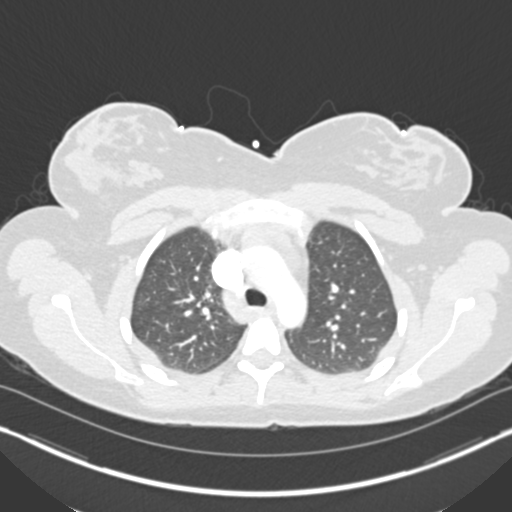
[im 142/204  soft-tissue]
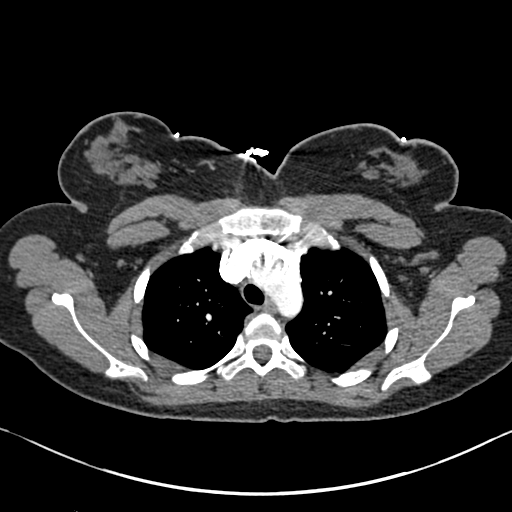
[im 159/204  lung]
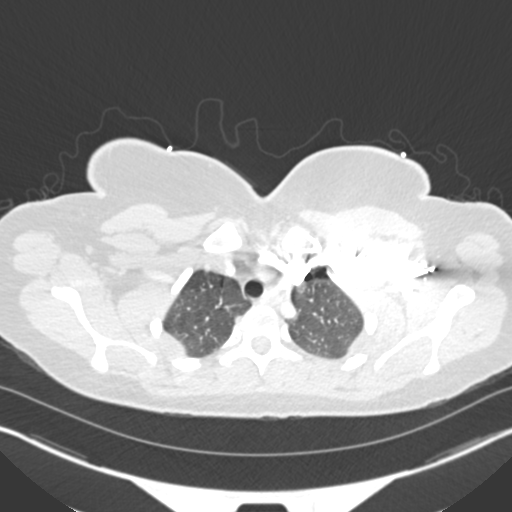
[im 168/204  soft-tissue]
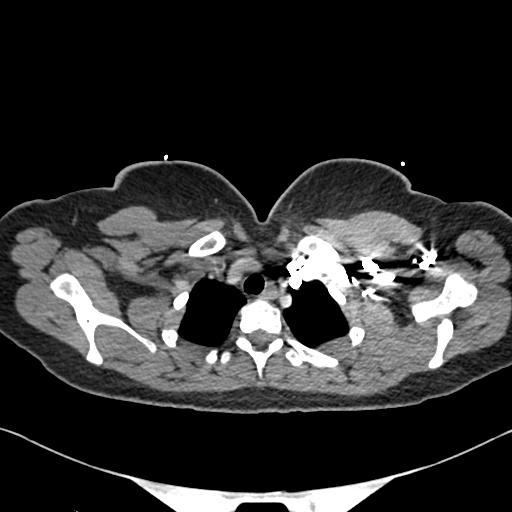
[im 177/204  lung]
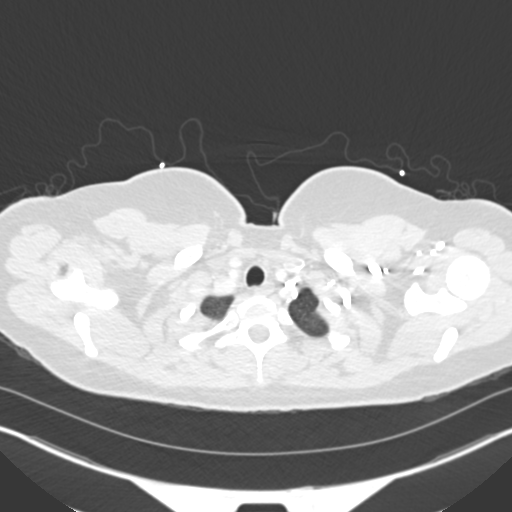
[im 195/204  soft-tissue]
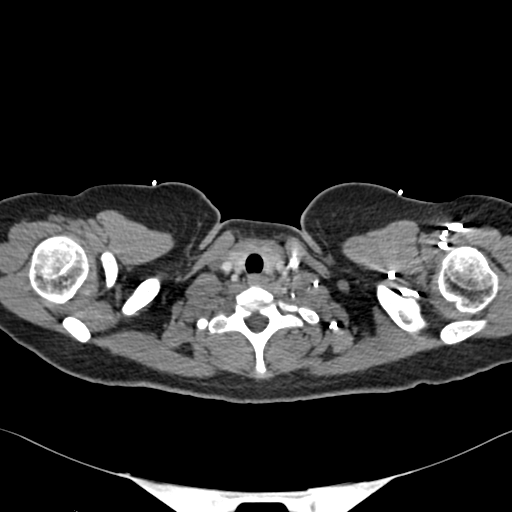

[Series 7: coronal mpr · coronal · 0.40mm/px · 2 of 83 slices shown]
[im 28/83  soft-tissue]
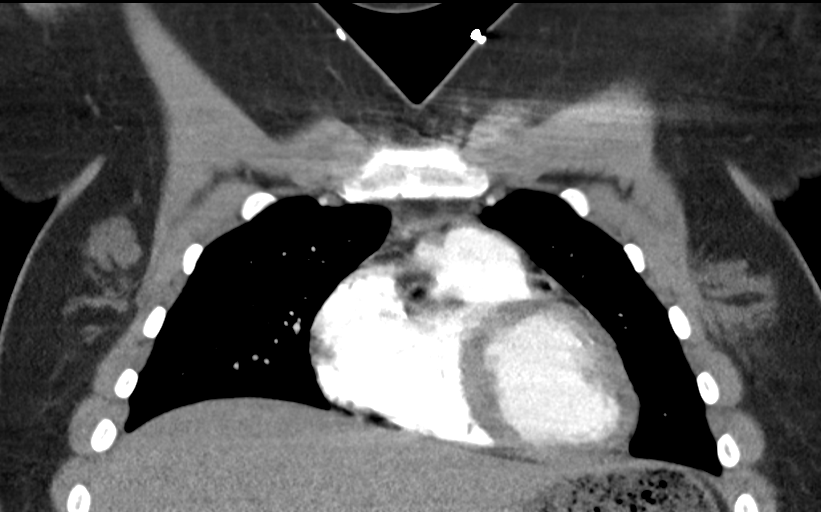
[im 55/83  soft-tissue]
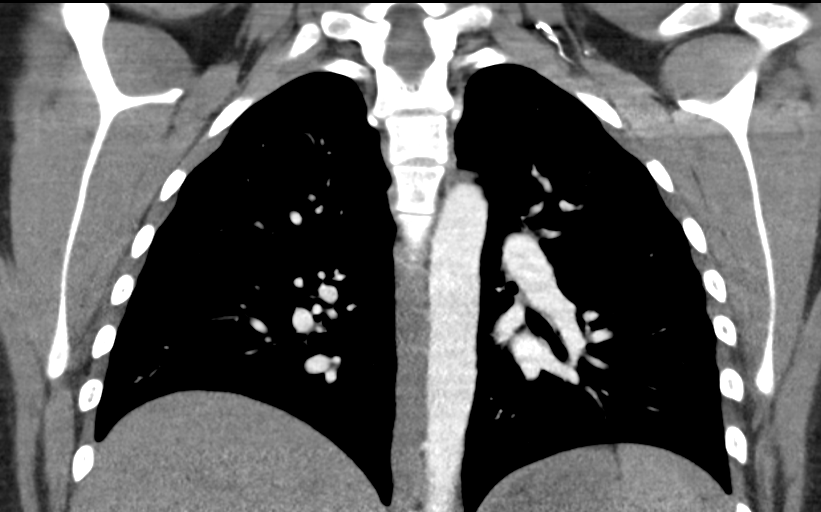

[18 of 46 positions shown; findings below may reference images not displayed]

FINDINGS: Cardiovascular: Satisfactory opacification of the pulmonary arteries
to the segmental level. No evidence of pulmonary embolism. Normal
heart size. No pericardial effusion.

Mediastinum/Nodes: No enlarged mediastinal, hilar, or axillary lymph
nodes. Thyroid gland, trachea, and esophagus demonstrate no
significant findings.

Lungs/Pleura: Lungs are clear. No pleural effusion or pneumothorax.

Upper Abdomen: No acute abnormality.

Musculoskeletal: No chest wall abnormality. No acute or significant
osseous findings.

Review of the MIP images confirms the above findings.
IMPRESSION: No evidence of pulmonary embolus or other acute cardiovascular
pathology by CT.

## 2018-10-09 ENCOUNTER — Encounter: Payer: Self-pay | Admitting: Obstetrics and Gynecology

## 2018-10-09 ENCOUNTER — Other Ambulatory Visit: Payer: Self-pay

## 2018-10-09 ENCOUNTER — Ambulatory Visit (INDEPENDENT_AMBULATORY_CARE_PROVIDER_SITE_OTHER): Payer: Medicaid Other | Admitting: Obstetrics and Gynecology

## 2018-10-09 VITALS — BP 104/69 | HR 70 | Ht 61.0 in | Wt 167.7 lb

## 2018-10-09 DIAGNOSIS — Z3009 Encounter for other general counseling and advice on contraception: Secondary | ICD-10-CM

## 2018-10-09 MED ORDER — NORELGESTROMIN-ETH ESTRADIOL 150-35 MCG/24HR TD PTWK: 1.0000 | MEDICATED_PATCH | TRANSDERMAL | 6 refills | Status: DC

## 2018-10-09 MED ORDER — NORELGESTROMIN-ETH ESTRADIOL 150-35 MCG/24HR TD PTWK: 1.0000 | MEDICATED_PATCH | TRANSDERMAL | 12 refills | Status: DC

## 2018-10-09 NOTE — Progress Notes (Signed)
  Subjective:     Patient ID: Yolanda Pierce, female   DOB: Oct 02, 1990, 28 y.o.   MRN: [id number]  HPI Here to discuss birth control options. Not interested in any more pregnancies. Used OCPs in the pill. Menses are normal and occur monthly.  Desires the [facility] patch. Is also past due for pap.  Review of Systems  All other systems reviewed and are negative.      Objective:   Physical Exam A&Ox4 Well groomed female in no distress Blood pressure 104/69, pulse 70, height 5\' 1"  (1.549 m), weight 167 lb 11.2 oz (76.1 kg), last menstrual period 09/11/2018. PE deferred    Assessment:     Contraception counseling    Plan:     Prescription for Xulane patch sent in and instructed on use and typical s/e's. RTC in 2 months for pap and med check.  Melody Shambley,CNM

## 2018-12-21 ENCOUNTER — Encounter: Payer: Self-pay | Admitting: Emergency Medicine

## 2018-12-21 ENCOUNTER — Other Ambulatory Visit: Payer: Self-pay

## 2018-12-21 ENCOUNTER — Emergency Department
Admission: EM | Admit: 2018-12-21 | Discharge: 2018-12-21 | Disposition: A | Discharge status: Home / Self Care | Payer: Self-pay | Attending: Emergency Medicine | Admitting: Emergency Medicine

## 2018-12-21 DIAGNOSIS — Y929 Unspecified place or not applicable: Secondary | ICD-10-CM | POA: Insufficient documentation

## 2018-12-21 DIAGNOSIS — W260XXA Contact with knife, initial encounter: Secondary | ICD-10-CM | POA: Insufficient documentation

## 2018-12-21 DIAGNOSIS — Y9389 Activity, other specified: Secondary | ICD-10-CM | POA: Insufficient documentation

## 2018-12-21 DIAGNOSIS — Y999 Unspecified external cause status: Secondary | ICD-10-CM | POA: Insufficient documentation

## 2018-12-21 DIAGNOSIS — S61211A Laceration without foreign body of left index finger without damage to nail, initial encounter: Secondary | ICD-10-CM | POA: Insufficient documentation

## 2018-12-21 DIAGNOSIS — Z79899 Other long term (current) drug therapy: Secondary | ICD-10-CM | POA: Insufficient documentation

## 2018-12-21 DIAGNOSIS — Z87891 Personal history of nicotine dependence: Secondary | ICD-10-CM | POA: Insufficient documentation

## 2018-12-21 MED ORDER — LIDOCAINE HCL (PF) 1 % IJ SOLN
5.0000 mL | Freq: Once | INTRAMUSCULAR | Status: AC
  Administered 2018-12-21: 5 mL
  Filled 2018-12-21: qty 5

## 2018-12-21 NOTE — Discharge Instructions (Signed)
Keep area clean and dry.  Clean daily with mild soap and water.  Tylenol if needed for pain.  You may follow-up with your primary care provider or return to the ED for suture removal in 10 days.

## 2018-12-21 NOTE — ED Notes (Signed)
See triage note   Presents with laceration to left index finger  States she was using a hook knife to open something  It caught her finger

## 2018-12-21 NOTE — ED Triage Notes (Signed)
Says she cut her left index finger this am and that if she removed bandage it still bleeds and she has pain.

## 2018-12-21 NOTE — ED Provider Notes (Signed)
[facility] Emergency Department Provider Note  ____________________________________________   First MD Initiated Contact with Patient 12/21/18 [id number]     (approximate)  I have reviewed the triage vital signs and the nursing notes.   HISTORY  Chief Complaint Laceration   HPI Yolanda Pierce is a 28 y.o. female presents to the ED with complaint of laceration to her left index finger.  Patient was using a "hook knife" to open some thinks that she was going to work in the yard with.  She states that she has been unable to control the bleeding.  She is up-to-date on immunizations.  She rates her pain as 6/10.       Past Medical History:  Diagnosis Date  . Anemia    On Iron tablets,once daily  . Anxiety   . Depression     Patient Active Problem List   Diagnosis Date Noted  . S/P cesarean section 07/16/2016  . 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 History of cesarean section 11/28/2015    Past Surgical History:  Procedure Laterality Date  . CESAREAN SECTION N/A 11/24/2013   Procedure: CESAREAN SECTION;  Surgeon: Sanjuana Kava, MD;  Location: [facility];  Service: Obstetrics;  Laterality: N/A;  . cesarean section  2015  . CESAREAN SECTION N/A 07/16/2016   Procedure: REPEAT CESAREAN SECTION;  Surgeon: Rubie Maid, MD;  Location: [facility];  Service: Obstetrics;  Laterality: N/A;    Prior to Admission medications   Medication Sig Start Date End Date Taking? Authorizing Provider  norelgestromin-ethinyl estradiol Marilu Favre) 150-35 MCG/24HR transdermal patch Place 1 patch onto the skin once a week. 10/09/18   Shambley, Melody N, CNM  norelgestromin-ethinyl estradiol Marilu Favre) 150-35 MCG/24HR transdermal patch Place 1 patch onto the skin once a week. For use as need if loses a patch. 10/09/18   Shambley, Melody N, CNM    Allergies Amoxicillin  Family History  Problem Relation Age of Onset  . Asthma Mother   . Hypertension Mother   . Diabetes Father    . Hypertension Father   . Sleep apnea Father   . Cancer Neg Hx     Social History Social History   Tobacco Use  . Smoking status: Former Research scientist (life sciences)  . Smokeless tobacco: Never Used  Substance Use Topics  . Alcohol use: No  . Drug use: Yes    Types: Marijuana    Comment: before pregnancy    Review of Systems Constitutional: No fever/chills Cardiovascular: Denies chest pain. Respiratory: Denies shortness of breath. Musculoskeletal: Left index finger pain. Skin: Laceration left index finger. Neurological: Negative for focal weakness or numbness. ____________________________________________   PHYSICAL EXAM:  VITAL SIGNS: ED Triage Vitals  Enc Vitals Group     BP 12/21/18 0837 128/73     Pulse Rate 12/21/18 0837 85     Resp 12/21/18 0837 16     Temp 12/21/18 0837 98.5 F (36.9 C)     Temp Source 12/21/18 0837 Oral     SpO2 12/21/18 0837 99 %     Weight 12/21/18 0831 167 lb (75.8 kg)     Height 12/21/18 0831 5\' 2"  (1.575 m)     Head Circumference --      Peak Flow --      Pain Score 12/21/18 0831 6     Pain Loc --      Pain Edu? --      Excl. in [place]? --    Constitutional: Alert and oriented. Well appearing and  in no acute distress. Eyes: Conjunctivae are normal.  Head: Atraumatic. Neck: No stridor.   Cardiovascular: Normal rate, regular rhythm. Grossly normal heart sounds.  Good peripheral circulation. Respiratory: Normal respiratory effort.  No retractions. Lungs CTAB. Musculoskeletal: On examination of left hand there is no gross deformity.  Patient is able move digits without any difficulty or restriction.  No foreign bodies noted.  Bleeding at this time is under control with direct pressure.  Patient is able to fully flex and extend without any difficulty.  Motor sensory function intact.  Capillary refills less than 3 seconds. Neurologic:  Normal speech and language. No gross focal neurologic deficits are appreciated. No gait instability. Skin:  Skin is warm, dry.   There is a small laceration noted to the left index finger on the lateral aspect without damage to the index finger.  Bleeding is controlled with direct pressure.  No foreign body is noted.   Psychiatric: Mood and affect are normal. Speech and behavior are normal.  ____________________________________________   LABS (all labs ordered are listed, but only abnormal results are displayed)  Labs Reviewed - No data to display ____________________________________________   PROCEDURES  Procedure(s) performed (including Critical Care):  Marland Kitchen.Marland Kitchen.Laceration Repair  Date/Time: 12/21/2018 9:51 AM Performed by: Tommi RumpsSummers, Rhonda L, PA-C Authorized by: Tommi RumpsSummers, Rhonda L, PA-C   Consent:    Consent obtained:  Verbal   Consent given by:  Patient   Risks discussed:  Pain and poor wound healing Anesthesia (see MAR for exact dosages):    Anesthesia method:  Local infiltration   Local anesthetic:  Lidocaine 1% w/o epi Laceration details:    Location:  Finger   Finger location:  L index finger   Length (cm):  1.5 Repair type:    Repair type:  Simple Pre-procedure details:    Preparation:  Patient was prepped and draped in usual sterile fashion Exploration:    Hemostasis achieved with:  Direct pressure   Wound exploration: wound explored through full range of motion and entire depth of wound probed and visualized     Contaminated: no   Treatment:    Area cleansed with:  Saline   Amount of cleaning:  Standard   Irrigation solution:  Sterile saline   Irrigation volume:  30 ml   Irrigation method:  Syringe and tap   Visualized foreign bodies/material removed: no   Skin repair:    Repair method:  Sutures   Suture size:  5-0   Suture material:  Nylon   Suture technique:  Simple interrupted   Number of sutures:  2 Approximation:    Approximation:  Close Post-procedure details:    Dressing:  Non-adherent dressing   Patient tolerance of procedure:  Tolerated well, no immediate complications      ____________________________________________   INITIAL IMPRESSION / ASSESSMENT AND PLAN / ED COURSE  As part of my medical decision making, I reviewed the following data within the electronic MEDICAL RECORD NUMBER Notes from prior ED visits and [place] Controlled Substance Database  28 year old female presents to the ED with laceration to her left index finger that was done this morning with a hook knife at home.  Patient was opening up some packages to do some yard work.  Tetanus is up-to-date.  Patient has laceration without foreign body and no damage to the nail.  Local anesthesia with 1% lidocaine and patient tolerated procedure well.  She was discharged with instructions to keep area clean and dry.  She is to have the sutures removed  in 2 days either with her PCP, urgent care or return to the ED.  ____________________________________________   FINAL CLINICAL IMPRESSION(S) / ED DIAGNOSES  Final diagnoses:  Laceration of left index finger without foreign body without damage to nail, initial encounter     ED Discharge Orders    None       Note:  This document was prepared using Dragon voice recognition software and may include unintentional dictation errors.    Tommi RumpsSummers, Rhonda L, PA-C 12/21/18 1410    Emily FilbertWilliams, Jonathan E, MD 12/21/18 [phone]

## 2019-01-01 ENCOUNTER — Ambulatory Visit (INDEPENDENT_AMBULATORY_CARE_PROVIDER_SITE_OTHER): Payer: Medicaid Other | Admitting: Obstetrics and Gynecology

## 2019-01-01 ENCOUNTER — Other Ambulatory Visit: Payer: Self-pay

## 2019-01-01 ENCOUNTER — Encounter: Payer: Self-pay | Admitting: Obstetrics and Gynecology

## 2019-01-01 ENCOUNTER — Other Ambulatory Visit (HOSPITAL_COMMUNITY)
Admission: RE | Admit: 2019-01-01 | Discharge: 2019-01-01 | Disposition: A | Discharge status: Home / Self Care | Payer: Medicaid Other | Source: Ambulatory Visit | Attending: Obstetrics and Gynecology | Admitting: Obstetrics and Gynecology

## 2019-01-01 VITALS — BP 115/68 | HR 91 | Ht 61.0 in | Wt 165.1 lb

## 2019-01-01 DIAGNOSIS — Z124 Encounter for screening for malignant neoplasm of cervix: Secondary | ICD-10-CM | POA: Diagnosis present

## 2019-01-01 DIAGNOSIS — Z3009 Encounter for other general counseling and advice on contraception: Secondary | ICD-10-CM

## 2019-01-01 NOTE — Progress Notes (Signed)
  Subjective:     Patient ID: Yolanda Pierce, female   DOB: 14-Jul-1990, 28 y.o.   MRN: [id number]  HPI Here to follow up o starting [facility] patch- states she is doing well and desires to continue the patch. Denies any concerns.   Review of Systems     Objective:   Physical Exam A&Ox4 Well groomed female in no distress Blood pressure 115/68, pulse 91, height _0  (1.549 m), weight 165 lb 1.6 oz (74.9 kg). HRR  Abdomen soft and not tender. Pelvic exam: normal external genitalia, vulva, vagina, cervix, uterus and adnexa, PAP: Pap smear done today, DNA probe for chlamydia and GC obtained.    Assessment:     Contraception management Pap screening     Plan:     Will continue with patch at this time RTC 1 year or as needed.  Keyshaun Exley,CNM

## 2019-01-05 LAB — CYTOLOGY - PAP
Chlamydia: NEGATIVE
Diagnosis: NEGATIVE
Neisseria Gonorrhea: NEGATIVE

## 2019-09-18 ENCOUNTER — Emergency Department: Payer: Medicaid Other

## 2019-09-18 ENCOUNTER — Other Ambulatory Visit: Payer: Self-pay

## 2019-09-18 ENCOUNTER — Emergency Department
Admission: EM | Admit: 2019-09-18 | Discharge: 2019-09-18 | Disposition: A | Discharge status: Home / Self Care | Payer: Medicaid Other | Attending: Emergency Medicine | Admitting: Emergency Medicine

## 2019-09-18 DIAGNOSIS — Z79899 Other long term (current) drug therapy: Secondary | ICD-10-CM | POA: Insufficient documentation

## 2019-09-18 DIAGNOSIS — S6721XA Crushing injury of right hand, initial encounter: Secondary | ICD-10-CM

## 2019-09-18 DIAGNOSIS — Y929 Unspecified place or not applicable: Secondary | ICD-10-CM | POA: Insufficient documentation

## 2019-09-18 DIAGNOSIS — Y939 Activity, unspecified: Secondary | ICD-10-CM | POA: Insufficient documentation

## 2019-09-18 DIAGNOSIS — Y999 Unspecified external cause status: Secondary | ICD-10-CM | POA: Insufficient documentation

## 2019-09-18 DIAGNOSIS — Z87891 Personal history of nicotine dependence: Secondary | ICD-10-CM | POA: Insufficient documentation

## 2019-09-18 DIAGNOSIS — W231XXA Caught, crushed, jammed, or pinched between stationary objects, initial encounter: Secondary | ICD-10-CM | POA: Insufficient documentation

## 2019-09-18 NOTE — ED Triage Notes (Signed)
Pt arrives POV from home after injuring her right hand around 4pm today. Pt reports she was in a van with an automatic door that closed on her hand while she was reaching for her seat belt. Pt holding right hand up to her chest. Pt able to move fingers but it causes pain to the hand.

## 2019-09-18 NOTE — ED Provider Notes (Signed)
[facility] Emergency Department Provider Note  ____________________________________________   First MD Initiated Contact with Patient 09/18/19 1924     (approximate)  I have reviewed the triage vital signs and the nursing notes.   HISTORY  Chief Complaint Hand Injury    HPI Yolanda Pierce is a 29 y.o. female presents emergency department complaining of right hand pain.  States she got her hand caught in the sliding door of a [place].  The close completely on her hand.  States that she continued to help her parents move items.  Painful to move her fingers.  No numbness or tingling.  No other injuries reported    Past Medical History:  Diagnosis Date  . Anemia    On Iron tablets,once daily  . Anxiety   . Depression     Patient Active Problem List   Diagnosis Date Noted  . S/P cesarean section 07/16/2016  . 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 History of cesarean section 11/28/2015    Past Surgical History:  Procedure Laterality Date  . CESAREAN SECTION N/A 11/24/2013   Procedure: CESAREAN SECTION;  Surgeon: Essie Hart, MD;  Location: [facility];  Service: Obstetrics;  Laterality: N/A;  . cesarean section  2015  . CESAREAN SECTION N/A 07/16/2016   Procedure: REPEAT CESAREAN SECTION;  Surgeon: Hildred Laser, MD;  Location: [facility];  Service: Obstetrics;  Laterality: N/A;    Prior to Admission medications   Medication Sig Start Date End Date Taking? Authorizing Provider  norelgestromin-ethinyl estradiol Burr Medico) 150-35 MCG/24HR transdermal patch Place 1 patch onto the skin once a week. 10/09/18   Shambley, Melody N, CNM    Allergies Amoxicillin  Family History  Problem Relation Age of Onset  . Asthma Mother   . Hypertension Mother   . Diabetes Father   . Hypertension Father   . Sleep apnea Father   . Cancer Neg Hx     Social History Social History   Tobacco Use  . Smoking status: Former Games developer  . Smokeless tobacco: Never Used   Substance Use Topics  . Alcohol use: No  . Drug use: Yes    Types: Marijuana    Comment: before pregnancy    Review of Systems  Constitutional: No fever/chills Eyes: No visual changes. ENT: No sore throat. Respiratory: Denies cough Genitourinary: Negative for dysuria. Musculoskeletal: Negative for back pain.  Positive for right hand pain Skin: Negative for rash. Psychiatric: no mood changes,     ____________________________________________   PHYSICAL EXAM:  VITAL SIGNS: ED Triage Vitals  Enc Vitals Group     BP 09/18/19 1815 115/79     Pulse Rate 09/18/19 1815 96     Resp 09/18/19 1815 18     Temp 09/18/19 1815 98.4 F (36.9 C)     Temp Source 09/18/19 1815 Oral     SpO2 09/18/19 1815 98 %     Weight 09/18/19 1812 155 lb (70.3 kg)     Height 09/18/19 1812 5\' 2"  (1.575 m)     Head Circumference --      Peak Flow --      Pain Score 09/18/19 1812 5     Pain Loc --      Pain Edu? --      Excl. in [place]? --     Constitutional: Alert and oriented. Well appearing and in no acute distress. Eyes: Conjunctivae are normal.  Head: Atraumatic. Nose: No congestion/rhinnorhea. Mouth/Throat: Mucous membranes are moist.   Neck:  supple no lymphadenopathy noted Cardiovascular: Normal rate, regular rhythm.  Respiratory: Normal respiratory effort.  No retractions,  GU: deferred Musculoskeletal: FROM all extremities, warm and well perfused, right hand is tender along the palm and dorsum of the metacarpals, neurovascular is intact Neurologic:  Normal speech and language.  Skin:  Skin is warm, dry and intact. No rash noted. Psychiatric: Mood and affect are normal. Speech and behavior are normal.  ____________________________________________   LABS (all labs ordered are listed, but only abnormal results are displayed)  Labs Reviewed - No data to display ____________________________________________   ____________________________________________  RADIOLOGY  X-ray of the  right hand is negative  ____________________________________________   PROCEDURES  Procedure(s) performed: Velcro cock-up splint applied by nursing staff   Procedures    ____________________________________________   INITIAL IMPRESSION / ASSESSMENT AND PLAN / ED COURSE  Pertinent labs & imaging results that were available during my care of the patient were reviewed by me and considered in my medical decision making (see chart for details).   Patient is a 29 year old female presents emergency department after a right hand injury.  See HPI  Physical exam shows patient to appear well.  Right hand is tender.  X-ray of the right hand is negative  Explained the findings to the patient.  Is placed in a cock-up splint.  She is to take either ibuprofen or Aleve, Tylenol for pain not controlled by this.  Elevate and ice the hand.  She was also given a work note and discharged stable condition.    Yolanda Pierce was evaluated in Emergency Department on 09/18/2019 for the symptoms described in the history of present illness. She was evaluated in the context of the global COVID-19 pandemic, which necessitated consideration that the patient might be at risk for infection with the SARS-CoV-2 virus that causes COVID-19. Institutional protocols and algorithms that pertain to the evaluation of patients at risk for COVID-19 are in a state of rapid change based on information released by regulatory bodies including the CDC and federal and state organizations. These policies and algorithms were followed during the patient's care in the ED.   As part of my medical decision making, I reviewed the following data within the [place] notes reviewed and incorporated, Old chart reviewed, Radiograph reviewed , Notes from prior ED visits and  Controlled Substance Database  ____________________________________________   FINAL CLINICAL IMPRESSION(S) / ED DIAGNOSES  Final  diagnoses:  Crushing injury of right hand, initial encounter      NEW MEDICATIONS STARTED DURING THIS VISIT:  Current Discharge Medication List       Note:  This document was prepared using Dragon voice recognition software and may include unintentional dictation errors.    Versie Starks, PA-C 09/18/19 1942    Harvest Dark, MD 09/18/19 2027

## 2019-09-18 NOTE — Discharge Instructions (Addendum)
Elevate and ice the hand.  Take Tylenol, ibuprofen or Aleve.  Follow-up with orthopedics if not improving in 1 week.  Return to the emergency department if you feel that your fingers are going cold or numb

## 2019-10-19 ENCOUNTER — Telehealth: Payer: Self-pay | Admitting: Certified Nurse Midwife

## 2019-10-19 NOTE — Telephone Encounter (Signed)
Pt called in and stated that she is out of her BC patches, the pt was last seen 12/2018. I told the pt mal is no longer at the practice and we will need to make you an appt with another provider. The pt made an appt 6/18. The pt is requesting a refill of her [facility] sent to [facility] on [address]. Please advise   Pt was also made aware that JML is out of the office till tomorrow. Pt verbally understood.

## 2019-10-20 ENCOUNTER — Other Ambulatory Visit: Payer: Self-pay

## 2019-10-20 MED ORDER — XULANE 150-35 MCG/24HR TD PTWK: 1.0000 | MEDICATED_PATCH | TRANSDERMAL | 0 refills | Status: DC

## 2019-10-21 ENCOUNTER — Other Ambulatory Visit: Payer: Self-pay | Admitting: Certified Nurse Midwife

## 2019-10-21 DIAGNOSIS — Z3045 Encounter for surveillance of transdermal patch hormonal contraceptive device: Secondary | ICD-10-CM

## 2019-10-21 MED ORDER — XULANE 150-35 MCG/24HR TD PTWK: 1.0000 | MEDICATED_PATCH | TRANSDERMAL | 0 refills | Status: DC

## 2019-10-21 NOTE — Telephone Encounter (Signed)
Called patient to inform her the prescription was refilled and we went ahead and scheduled that physical!

## 2019-10-21 NOTE — Progress Notes (Signed)
Rx Burr Medico, see orders.    Gunnar Bulla, CNM [facility], [facility] 10/21/19 9:43 AM

## 2019-10-21 NOTE — Telephone Encounter (Signed)
Please contact patient, she does NOT need an appointment next week. Annual exam needs to be scheduled for after 01/01/2020. Let patient know that refills were sent to her pharmacy on file. Thanks, JML

## 2019-10-29 ENCOUNTER — Encounter: Payer: Medicaid Other | Admitting: Certified Nurse Midwife

## 2019-12-31 ENCOUNTER — Encounter: Payer: Medicaid Other | Admitting: Certified Nurse Midwife

## 2020-01-25 ENCOUNTER — Encounter: Payer: Self-pay | Admitting: Certified Nurse Midwife

## 2020-04-24 ENCOUNTER — Other Ambulatory Visit: Payer: Self-pay

## 2020-04-24 ENCOUNTER — Encounter: Payer: Self-pay | Admitting: Certified Nurse Midwife

## 2020-04-24 ENCOUNTER — Ambulatory Visit (INDEPENDENT_AMBULATORY_CARE_PROVIDER_SITE_OTHER): Payer: Medicaid Other | Admitting: Certified Nurse Midwife

## 2020-04-24 VITALS — BP 116/71 | HR 69 | Ht 62.0 in | Wt 144.3 lb

## 2020-04-24 DIAGNOSIS — Z01419 Encounter for gynecological examination (general) (routine) without abnormal findings: Secondary | ICD-10-CM | POA: Diagnosis not present

## 2020-04-24 DIAGNOSIS — N946 Dysmenorrhea, unspecified: Secondary | ICD-10-CM | POA: Diagnosis not present

## 2020-04-24 DIAGNOSIS — Z3045 Encounter for surveillance of transdermal patch hormonal contraceptive device: Secondary | ICD-10-CM | POA: Diagnosis not present

## 2020-04-24 MED ORDER — XULANE 150-35 MCG/24HR TD PTWK: 1.0000 | MEDICATED_PATCH | TRANSDERMAL | 4 refills | Status: DC

## 2020-04-24 NOTE — Progress Notes (Signed)
I have seen, interviewed, and examined the patient in conjunction with the [facility] midwife and affirm the diagnosis and management plan.   Gunnar Bulla, CNM [facility], [facility] 04/24/20 5:23 PM

## 2020-04-24 NOTE — Progress Notes (Signed)
Pt present for annual exam. No complaints. °

## 2020-04-24 NOTE — Patient Instructions (Signed)
Ethinyl Estradiol; Norelgestromin skin patches What is this medicine? ETHINYL ESTRADIOL;NORELGESTROMIN (ETH in il es tra DYE ole; nor el JES troe min) skin patch is used as a contraceptive (birth control method). This medicine combines two types of female hormones, an estrogen and a progestin. This patch is used to prevent ovulation and pregnancy. This medicine may be used for other purposes; ask your health care provider or pharmacist if you have questions. COMMON BRAND NAME(S): Ortho Becky Sax What should I tell my health care provider before I take this medicine? They need to know if you have or ever had any of these conditions:  abnormal vaginal bleeding  blood vessel disease or blood clots  breast, cervical, endometrial, ovarian, liver, or uterine cancer  diabetes  gallbladder disease  having surgery  heart disease or recent heart attack  high blood pressure  high cholesterol or triglycerides  history of irregular heartbeat or heart valve problems  kidney disease  liver disease  migraine headaches  protein C deficiency  protein S deficiency  recently had a baby, miscarriage, or abortion  stroke  systemic lupus erythematosus (SLE)  tobacco smoker  an unusual or allergic reaction to estrogens, progestins, other medicines, foods, dyes, or preservatives  pregnant or trying to get pregnant  breast-feeding How should I use this medicine? This patch is applied to the skin. Follow the directions on the prescription label. Apply to clean, dry, healthy skin on the buttock, abdomen, upper outer arm or upper torso, in a place where it will not be rubbed by tight clothing. Do not use lotions or other cosmetics on the site where the patch will go. Press the patch firmly in place for 10 seconds to ensure good contact with the skin. Change the patch every 7 days on the same day of the week for 3 weeks. You will then have a break from the patch for 1 week, after which you  will apply a new patch. Do not use your medicine more often than directed. Contact your pediatrician regarding the use of this medicine in children. Special care may be needed. This medicine has been used in female children who have started having menstrual periods. A patient package insert for the product will be given with each prescription and refill. Read this sheet carefully each time. The sheet may change frequently. Overdosage: If you think you have taken too much of this medicine contact a poison control center or emergency room at once. NOTE: This medicine is only for you. Do not share this medicine with others. What if I miss a dose? You will need to replace your patch once a week as directed. If your patch is lost or falls off, contact your health care professional for advice. You may need to use another form of birth control if your patch has been off for more than 1 day. What may interact with this medicine? Do not take this medicine with the following medications:  dasabuvir; ombitasvir; paritaprevir; ritonavir  ombitasvir; paritaprevir; ritonavir This medicine may also interact with the following medications:  acetaminophen  antibiotics or medicines for infections, especially rifampin, rifabutin, rifapentine, and possibly penicillins or tetracyclines  aprepitant or fosaprepitant  armodafinil  ascorbic acid (vitamin C)  barbiturate medicines, such as phenobarbital or primidone  bosentan  certain antiviral medicines for hepatitis, HIV or AIDS  certain medicines for cancer treatment  certain medicines for seizures like carbamazepine, clobazam, felbamate, lamotrigine, oxcarbazepine, phenytoin, rufinamide, topiramate  certain medicines for treating high cholesterol  cyclosporine  dantrolene  elagolix  flibanserin  grapefruit juice  lesinurad  medicines for diabetes  medicines to treat fungal infections, such as griseofulvin, miconazole, fluconazole,  ketoconazole, itraconazole, posaconazole or voriconazole  mifepristone  mitotane  modafinil  morphine  mycophenolate  St. John's wort  tamoxifen  temazepam  theophylline or aminophylline  thyroid hormones  tizanidine  tranexamic acid  ulipristal  warfarin This list may not describe all possible interactions. Give your health care provider a list of all the medicines, herbs, non-prescription drugs, or dietary supplements you use. Also tell them if you smoke, drink alcohol, or use illegal drugs. Some items may interact with your medicine. What should I watch for while using this medicine? Visit your doctor or health care professional for regular checks on your progress. You will need a regular breast and pelvic exam and Pap smear while on this medicine. Use an additional method of contraception during the first cycle that you use this patch. If you have any reason to think you are pregnant, stop using this medicine right away and contact your doctor or health care professional. If you are using this medicine for hormone related problems, it may take several cycles of use to see improvement in your condition. Smoking increases the risk of getting a blood clot or having a stroke while you are using hormonal birth control, especially if you are more than 27 years old. You are strongly advised not to smoke. This medicine can make your body retain fluid, making your fingers, hands, or ankles swell. Your blood pressure can go up. Contact your doctor or health care professional if you feel you are retaining fluid. This medicine can make you more sensitive to the sun. Keep out of the sun. If you cannot avoid being in the sun, wear protective clothing and use sunscreen. Do not use sun lamps or tanning beds/booths. If you wear contact lenses and notice visual changes, or if the lenses begin to feel uncomfortable, consult your eye care specialist. In some women, tenderness, swelling, or  minor bleeding of the gums may occur. Notify your dentist if this happens. Brushing and flossing your teeth regularly may help limit this. See your dentist regularly and inform your dentist of the medicines you are taking. If you are going to have elective surgery or a MRI, you may need to stop using this medicine before the surgery or MRI. Consult your health care professional for advice. This medicine does not protect you against HIV infection (AIDS) or any other sexually transmitted diseases. What side effects may I notice from receiving this medicine? Side effects that you should report to your doctor or health care professional as soon as possible:  allergic reactions such as skin rash or itching, hives, swelling of the lips, mouth, tongue, or throat  breast tissue changes or discharge  dark patches of skin on your forehead, cheeks, upper lip, and chin  depression  high blood pressure  migraines or severe, sudden headaches  missed menstrual periods  signs and symptoms of a blood clot such as breathing problems; changes in vision; chest pain; severe, sudden headache; pain, swelling, warmth in the leg; trouble speaking; sudden numbness or weakness of the face, arm or leg  skin reactions at the patch site such as blistering, bleeding, itching, rash, or swelling  stomach pain  yellowing of the eyes or skin Side effects that usually do not require medical attention (report these to your doctor or health care professional if they continue or are bothersome):  breast tenderness  irregular vaginal bleeding or spotting, particularly during the first 3 months of use  headache  nausea  painful menstrual periods  skin redness or mild irritation at site where applied  weight gain (slight) This list may not describe all possible side effects. Call your doctor for medical advice about side effects. You may report side effects to FDA at 1-800-FDA-1088. Where should I keep my  medicine? Keep out of the reach of children. Store at room temperature between 15 and 30 degrees C (59 and 86 degrees F). Keep the patch in its pouch until time of use. Throw away any unused medicine after the expiration date. Dispose of used patches properly. Since a used patch may still contain active hormones, fold the patch in half so that it sticks to itself prior to disposal. Throw away in a place where children or pets cannot reach. NOTE: This sheet is a summary. It may not cover all possible information. If you have questions about this medicine, talk to your doctor, pharmacist, or health care provider.  2020 Elsevier/Gold Standard (2018-08-04 11:56:29)   Preventive Care 66-70 Years Old, Female Preventive care refers to visits with your health care provider and lifestyle choices that can promote health and wellness. This includes:  A yearly physical exam. This may also be called an annual well check.  Regular dental visits and eye exams.  Immunizations.  Screening for certain conditions.  Healthy lifestyle choices, such as eating a healthy diet, getting regular exercise, not using drugs or products that contain nicotine and tobacco, and limiting alcohol use. What can I expect for my preventive care visit? Physical exam Your health care provider will check your:  Height and weight. This may be used to calculate body mass index (BMI), which tells if you are at a healthy weight.  Heart rate and blood pressure.  Skin for abnormal spots. Counseling Your health care provider may ask you questions about your:  Alcohol, tobacco, and drug use.  Emotional well-being.  Home and relationship well-being.  Sexual activity.  Eating habits.  Work and work Statistician.  Method of birth control.  Menstrual cycle.  Pregnancy history. What immunizations do I need?  Influenza (flu) vaccine  This is recommended every year. Tetanus, diphtheria, and pertussis (Tdap)  vaccine  You may need a Td booster every 10 years. Varicella (chickenpox) vaccine  You may need this if you have not been vaccinated. Human papillomavirus (HPV) vaccine  If recommended by your health care provider, you may need three doses over 6 months. Measles, mumps, and rubella (MMR) vaccine  You may need at least one dose of MMR. You may also need a second dose. Meningococcal conjugate (MenACWY) vaccine  One dose is recommended if you are age 37-21 years and a first-year college student living in a residence hall, or if you have one of several medical conditions. You may also need additional booster doses. Pneumococcal conjugate (PCV13) vaccine  You may need this if you have certain conditions and were not previously vaccinated. Pneumococcal polysaccharide (PPSV23) vaccine  You may need one or two doses if you smoke cigarettes or if you have certain conditions. Hepatitis A vaccine  You may need this if you have certain conditions or if you travel or work in places where you may be exposed to hepatitis A. Hepatitis B vaccine  You may need this if you have certain conditions or if you travel or work in places where you may be exposed to hepatitis B. Haemophilus  influenzae type b (Hib) vaccine  You may need this if you have certain conditions. You may receive vaccines as individual doses or as more than one vaccine together in one shot (combination vaccines). Talk with your health care provider about the risks and benefits of combination vaccines. What tests do I need?  Blood tests  Lipid and cholesterol levels. These may be checked every 5 years starting at age 57.  Hepatitis C test.  Hepatitis B test. Screening  Diabetes screening. This is done by checking your blood sugar (glucose) after you have not eaten for a while (fasting).  Sexually transmitted disease (STD) testing.  BRCA-related cancer screening. This may be done if you have a family history of breast,  ovarian, tubal, or peritoneal cancers.  Pelvic exam and Pap test. This may be done every 3 years starting at age 34. Starting at age 13, this may be done every 5 years if you have a Pap test in combination with an HPV test. Talk with your health care provider about your test results, treatment options, and if necessary, the need for more tests. Follow these instructions at home: Eating and drinking   Eat a diet that includes fresh fruits and vegetables, whole grains, lean protein, and low-fat dairy.  Take vitamin and mineral supplements as recommended by your health care provider.  Do not drink alcohol if: ? Your health care provider tells you not to drink. ? You are pregnant, may be pregnant, or are planning to become pregnant.  If you drink alcohol: ? Limit how much you have to 0-1 drink a day. ? Be aware of how much alcohol is in your drink. In the [place] one drink equals one 12 oz bottle of beer (355 mL), one 5 oz glass of wine (148 mL), or one 1 oz glass of hard liquor (44 mL). Lifestyle  Take daily care of your teeth and gums.  Stay active. Exercise for at least 30 minutes on 5 or more days each week.  Do not use any products that contain nicotine or tobacco, such as cigarettes, e-cigarettes, and chewing tobacco. If you need help quitting, ask your health care provider.  If you are sexually active, practice safe sex. Use a condom or other form of birth control (contraception) in order to prevent pregnancy and STIs (sexually transmitted infections). If you plan to become pregnant, see your health care provider for a preconception visit. What's next?  Visit your health care provider once a year for a well check visit.  Ask your health care provider how often you should have your eyes and teeth checked.  Stay up to date on all vaccines. This information is not intended to replace advice given to you by your health care provider. Make sure you discuss any questions you have with  your health care provider. Document Revised: 01/08/2018 Document Reviewed: 01/08/2018 Elsevier Patient Education  2020 [employer].

## 2020-04-24 NOTE — Progress Notes (Signed)
ANNUAL PREVENTATIVE CARE GYN  ENCOUNTER NOTE  Subjective:       Yolanda Pierce is a 29 y.o. [phone] female here for a routine annual gynecologic exam.  Current complaints: 1. Requests Xulane refill  Denies difficulty breathing, chest pain, swelling, abdominal pain, constipation, excessive vaginal bleeding, and leg pain or swelling.    Gynecologic History  Patient's last menstrual period was 04/16/2020.  Period Cycle (Days): 28 Period Duration (Days): 5-6 Period Pattern: Regular Menstrual Flow: Heavy Menstrual Control: Tampon Menstrual Control Change Freq (Hours): 2-3 Dysmenorrhea: (!) Severe Dysmenorrhea Symptoms: Cramping,Diarrhea,Headache,Nausea  Contraception: Ortho-Evra patches weekly Burr Medico)  Last Pap: 01/01/2019. Results were: normal  Obstetric History  OB History  Gravida Para Term Preterm AB Living  2 2 2  0 0 2  SAB IAB Ectopic Multiple Live Births  0 0 0 0 2    # Outcome Date GA Lbr Len/2nd Weight Sex Delivery Anes PTL Lv  2 Term 07/16/16 [redacted]w[redacted]d  4451 g M Vag-Spont EPI N LIV  1 Term 11/24/13 [redacted]w[redacted]d  3740 g F CS-LTranv EPI  LIV    Past Medical History:  Diagnosis Date   Anemia    On Iron tablets,once daily   Anxiety    Depression     Past Surgical History:  Procedure Laterality Date   CESAREAN SECTION N/A 11/24/2013   Procedure: CESAREAN SECTION;  Surgeon: 11/26/2013, MD;  Location: [facility];  Service: Obstetrics;  Laterality: N/A;   cesarean section  2015   CESAREAN SECTION N/A 07/16/2016   Procedure: REPEAT CESAREAN SECTION;  Surgeon: 09/15/2016, MD;  Location: [facility] ORS;  Service: Obstetrics;  Laterality: N/A;    Current Outpatient Medications on File Prior to Visit  Medication Sig Dispense Refill   Ferrous Sulfate (IRON PO) Take by mouth.     norelgestromin-ethinyl estradiol H[facility]red [facility]) 150-35 MCG/24HR transdermal patch Place 1 patch onto the skin once a week. (Patient not taking: Reported on 04/24/2020) 9 patch 0   No current  facility-administered medications on file prior to visit.    Allergies  Allergen Reactions   Amoxicillin Rash, Anaphylaxis and Hives    Redness and rash all over. Has patient had a PCN reaction causing immediate rash, facial/tongue/throat swelling, SOB or lightheadedness with hypotension: Yes Has patient had a PCN reaction causing severe rash involving mucus membranes or skin necrosis: No Has patient had a PCN reaction that required hospitalization Yes Has patient had a PCN reaction occurring within the last 10 years: No If all of the above answers are "NO", then may proceed with Cephalosporin use.    Social History   Socioeconomic History   Marital status: Single    Spouse name: Not on file   Number of children: Not on file   Years of education: Not on file   Highest education level: Not on file  Occupational History   Not on file  Tobacco Use   Smoking status: Former Smoker   Smokeless tobacco: Never Used  04/26/2020 Use: Never used  Substance and Sexual Activity   Alcohol use: No   Drug use: Yes    Types: Marijuana   Sexual activity: Yes    Birth control/protection: Coitus interruptus  Other Topics Concern   Not on file  Social History Narrative   Not on file   Social Determinants of Health   Financial Resource Strain: Not on file  Food Insecurity: Not on file  Transportation Needs: Not on file  Physical Activity: Not on file  Stress: Not on file  Social Connections: Not on file  Intimate Partner Violence: Not on file    Family History  Problem Relation Age of Onset   Asthma Mother    Hypertension Mother    Diabetes Father    Hypertension Father    Sleep apnea Father    Cancer Neg Hx     The following portions of the patient's history were reviewed and updated as appropriate: allergies, current medications, past family history, past medical history, past social history, past surgical history and problem list.  Review of  Systems  ROS negative except as noted above. Information obtained from patient.    Objective:   BP 116/71    Pulse 69    Ht 5\' 2"  (1.575 m)    Wt 65.5 kg    LMP 04/16/2020    BMI 26.39 kg/m    CONSTITUTIONAL: Well-developed, well-nourished female in no acute distress.   PSYCHIATRIC: Normal mood and affect. Normal behavior. Normal judgment and thought content.  NEUROLGIC: Alert and oriented to person, place, and time. Normal muscle tone coordination. No cranial nerve deficit noted.  EYES: Conjunctivae and EOM are normal.   NECK: Normal range of motion, supple, no masses.  Normal thyroid.   SKIN: Skin is warm and dry. No rash noted. Not diaphoretic. No erythema. No pallor.  CARDIOVASCULAR: Normal heart rate noted, regular rhythm, no murmur.  RESPIRATORY: Clear to auscultation bilaterally. Effort and breath sounds normal, no problems with respiration noted.  BREASTS: Symmetric in size. No masses, skin changes, nipple drainage, or lymphadenopathy.  ABDOMEN: Soft, normal bowel sounds, no distention noted.  No tenderness, rebound or guarding.   PELVIC:  External Genitalia: Normal  Vagina: Normal  Cervix: Normal  Uterus: Normal  Adnexa: Normal except for right side tenderness with exam  MUSCULOSKELETAL: Normal range of motion. No tenderness.  No cyanosis, clubbing, or edema.    Assessment:   Annual gynecologic examination 29 y.o.   Contraception: Ortho-Evra patches weekly 34)  Overweight   Problem List Items Addressed This Visit   None   Visit Diagnoses    Well woman exam    -  Primary   Contraceptive patch status          Plan:   Pap: Not needed  Labs: Declines labs at this time   Routine preventative health maintenance measures emphasized: Exercise/Diet/Weight control, Tobacco Warnings, Alcohol/Substance use risks, Stress Management, Peer Pressure Issues and Safe Sex, See AVS  Discussed tenderness on bimanual exam. Patient agrees to watchful waiting and  will contact CNM with changes  Reviewed red flag symptoms and when to call  RTC x 1 year for ANNUAL EXAM with JML or sooner if needed    Burr Medico, Student-MidWife  [employer] 04/24/20 3:45 PM

## 2020-06-06 ENCOUNTER — Telehealth: Payer: Self-pay

## 2020-06-06 DIAGNOSIS — N921 Excessive and frequent menstruation with irregular cycle: Secondary | ICD-10-CM

## 2020-06-06 NOTE — Telephone Encounter (Signed)
Patient called in stating that she had some questions regarding her birth control patch Rx. Patient states that she is having some vaginal spotting x2 weeks and that she has yet to start a normal period. Patient would like a call back to see if this is normal or if there is anything she could do.  Could you please advise?

## 2020-06-08 ENCOUNTER — Telehealth: Payer: Self-pay

## 2020-06-08 DIAGNOSIS — N921 Excessive and frequent menstruation with irregular cycle: Secondary | ICD-10-CM

## 2020-06-08 NOTE — Telephone Encounter (Signed)
Telephone call to patient, verified full name and date of birth.   Skipped a period using patch. Reports spotting. Expected period at the start the of the month.   Reviewed red flag symptoms and when to call.   Continue contraception as prescribed.   Yolanda Pierce, CNM [facility], [facility] 06/08/20 3:20 PM

## 2020-06-08 NOTE — Telephone Encounter (Signed)
Telephone call to patient, HIPPA voicemail left on identified line asking patient to call back or sent message via MyChart.    Yolanda Pierce, CNM [facility], [facility] 06/08/20 3:04 PM

## 2020-06-08 NOTE — Telephone Encounter (Signed)
Pt called in and stated that she just missed a call from [facility]. Called back to Vernice Jefferson states to send telephone message. Informed pt that I will send a message and Marcelino Duster will return her call. Please advise

## 2020-06-21 ENCOUNTER — Other Ambulatory Visit: Payer: Self-pay

## 2020-06-21 ENCOUNTER — Emergency Department
Admission: EM | Admit: 2020-06-21 | Discharge: 2020-06-21 | Disposition: A | Discharge status: Home / Self Care | Payer: Medicaid Other | Attending: Emergency Medicine | Admitting: Emergency Medicine

## 2020-06-21 ENCOUNTER — Encounter: Payer: Self-pay | Admitting: Emergency Medicine

## 2020-06-21 DIAGNOSIS — H9192 Unspecified hearing loss, left ear: Secondary | ICD-10-CM | POA: Insufficient documentation

## 2020-06-21 DIAGNOSIS — H60392 Other infective otitis externa, left ear: Secondary | ICD-10-CM | POA: Insufficient documentation

## 2020-06-21 DIAGNOSIS — Z87891 Personal history of nicotine dependence: Secondary | ICD-10-CM | POA: Insufficient documentation

## 2020-06-21 MED ORDER — ACETAMINOPHEN 325 MG PO TABS
650.0000 mg | ORAL_TABLET | Freq: Once | ORAL | Status: AC
  Administered 2020-06-21: 650 mg via ORAL
  Filled 2020-06-21: qty 2

## 2020-06-21 MED ORDER — OXYCODONE-ACETAMINOPHEN 5-325 MG PO TABS: 1.0000 | ORAL_TABLET | Freq: Four times a day (QID) | ORAL | 0 refills | Status: DC | PRN

## 2020-06-21 MED ORDER — NEOMYCIN-COLIST-HC-THONZONIUM 3.3-3-10-0.5 MG/ML OT SUSP
3.0000 [drp] | Freq: Four times a day (QID) | OTIC | Status: DC
  Administered 2020-06-21: 3 [drp] via OTIC
  Filled 2020-06-21: qty 10

## 2020-06-21 NOTE — ED Triage Notes (Signed)
Pt comes into the ED via POV c/o left ear pain that started yesterday.  Pt states it is a shooting pain.  Pt in NAD at this time and is ambulatory to triage.

## 2020-06-21 NOTE — Discharge Instructions (Addendum)
Discharge care instructions and use eardrops as directed.

## 2020-06-21 NOTE — ED Notes (Signed)
Pt resting in room with no distress at this time.

## 2020-06-21 NOTE — ED Provider Notes (Signed)
[facility] Emergency Department Provider Note   ____________________________________________   Event Date/Time   First MD Initiated Contact with Patient 06/21/20 [phone]     (approximate)  I have reviewed the triage vital signs and the nursing notes.   HISTORY  Chief Complaint Otalgia    HPI Yolanda Pierce is a 30 y.o. female patient presents with left ear pain studies today.  Patient described the pain as "shooting".  Patient is a mild hearing loss.  Patient denies vertigo.  Patient rates pain as a 9/10.  No palliative measure for complaint.         Past Medical History:  Diagnosis Date  . Anemia    On Iron tablets,once daily  . Anxiety   . Depression     Patient Active Problem List   Diagnosis Date Noted  . 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 History of cesarean section 11/28/2015    Past Surgical History:  Procedure Laterality Date  . CESAREAN SECTION N/A 11/24/2013   Procedure: CESAREAN SECTION;  Surgeon: Essie Hart, MD;  Location: [facility] ORS;  Service: Obstetrics;  Laterality: N/A;  . cesarean section  2015  . CESAREAN SECTION N/A 07/16/2016   Procedure: REPEAT CESAREAN SECTION;  Surgeon: Hildred Laser, MD;  Location: [facility] ORS;  Service: Obstetrics;  Laterality: N/A;    Prior to Admission medications   Medication Sig Start Date End Date Taking? Authorizing Provider  oxyCODONE-acetaminophen (PERCOCET) 5-325 MG tablet Take 1 tablet by mouth every 6 (six) hours as needed for severe pain. 06/21/20 06/21/21 Yes Joni Reining, PA-C  Ferrous Sulfate (IRON PO) Take by mouth.    [provider]  norelgestromin-ethinyl estradiol [facility]) 150-35 MCG/24HR transdermal patch Place 1 patch onto the skin once a week. 04/24/20   Gunnar Bulla, CNM    Allergies Amoxicillin  Family History  Problem Relation Age of Onset  . Asthma Mother   . Hypertension Mother   . Diabetes Father   . Hypertension Father   . Sleep  apnea Father   . Cancer Neg Hx     Social History Social History   Tobacco Use  . Smoking status: Former Games developer  . Smokeless tobacco: Never Used  Vaping Use  . Vaping Use: Never used  Substance Use Topics  . Alcohol use: No  . Drug use: Yes    Types: Marijuana    Review of Systems Constitutional: No fever/chills Eyes: No visual changes. ENT: No sore throat. Cardiovascular: Denies chest pain. Respiratory: Denies shortness of breath. Gastrointestinal: No abdominal pain.  No nausea, no vomiting.  No diarrhea.  No constipation. Genitourinary: Negative for dysuria. Musculoskeletal: Negative for back pain. Skin: Negative for rash. Neurological: Negative for headaches, focal weakness or numbness. Psychiatric:  Anxiety and depression. Allergic/Immunilogical: Amoxicillin. ____________________________________________   PHYSICAL EXAM:  VITAL SIGNS: ED Triage Vitals  Enc Vitals Group     BP 06/21/20 0756 120/82     Pulse Rate 06/21/20 0756 84     Resp 06/21/20 0756 16     Temp 06/21/20 0756 98.4 F (36.9 C)     Temp Source 06/21/20 0756 Oral     SpO2 06/21/20 0756 98 %     Weight 06/21/20 0751 150 lb (68 kg)     Height 06/21/20 0751 5\' 2"  (1.575 m)     Head Circumference --      Peak Flow --      Pain Score 06/21/20 0751 9     Pain Loc --  Pain Edu? --      Excl. in GC? --     Constitutional: Alert and oriented. Well appearing and in no acute distress. Eyes: Conjunctivae are normal. PERRL. EOMI. Head: Atraumatic. Nose: No congestion/rhinnorhea. EARS: Edematous left canal.  TM not visible. Mouth/Throat: Mucous membranes are moist.  Oropharynx non-erythematous. Hematological/Lymphatic/Immunilogical: No cervical lymphadenopathy. Cardiovascular: Normal rate, regular rhythm. Grossly normal heart sounds.  Good peripheral circulation. Respiratory: Normal respiratory effort.  No retractions. Lungs CTAB. Gastrointestinal: Soft and nontender. No distention. No abdominal  bruits. No CVA tenderness. Genitourinary: Deferred Neurologic:  Normal speech and language. No gross focal neurologic deficits are appreciated. No gait instability. Skin:  Skin is warm, dry and intact. No rash noted. Psychiatric: Mood and affect are normal. Speech and behavior are normal.  ____________________________________________   LABS (all labs ordered are listed, but only abnormal results are displayed)  Labs Reviewed - No data to display ____________________________________________  EKG   ____________________________________________  RADIOLOGY I, Joni Reining, personally viewed and evaluated these images (plain radiographs) as part of my medical decision making, as well as reviewing the written report by the radiologist.  ED MD interpretation:    Official radiology report(s): No results found.  ____________________________________________   PROCEDURES  Procedure(s) performed (including Critical Care):  Procedures   ____________________________________________   INITIAL IMPRESSION / ASSESSMENT AND PLAN / ED COURSE  As part of my medical decision making, I reviewed the following data within the electronic MEDICAL RECORD NUMBER         Patient presents with left ear pain and edematous left ear canal.  Patient complaining physical exam consistent with otitis externa.  A week was placed in ear and 3 drops of Cortisporin was applied.  Patient given discharge care instruction advised take medication as directed.  Patient advised follow-up to ENT clinic if no improvement or worsening complaint in 3 days.      ____________________________________________   FINAL CLINICAL IMPRESSION(S) / ED DIAGNOSES  Final diagnoses:  Other infective acute otitis externa of left ear     ED Discharge Orders         Ordered    oxyCODONE-acetaminophen (PERCOCET) 5-325 MG tablet  Every 6 hours PRN        06/21/20 0823          *Please note:  Yolanda Pierce was  evaluated in Emergency Department on 06/21/2020 for the symptoms described in the history of present illness. She was evaluated in the context of the global COVID-19 pandemic, which necessitated consideration that the patient might be at risk for infection with the SARS-CoV-2 virus that causes COVID-19. Institutional protocols and algorithms that pertain to the evaluation of patients at risk for COVID-19 are in a state of rapid change based on information released by regulatory bodies including the CDC and federal and state organizations. These policies and algorithms were followed during the patient's care in the ED.  Some ED evaluations and interventions may be delayed as a result of limited staffing during and the pandemic.*   Note:  This document was prepared using Dragon voice recognition software and may include unintentional dictation errors.    Joni Reining, PA-C 06/21/20 1829    Dionne Bucy, MD 06/21/20 1122

## 2021-04-30 ENCOUNTER — Encounter: Payer: Medicaid Other | Admitting: Certified Nurse Midwife

## 2021-05-24 ENCOUNTER — Ambulatory Visit (INDEPENDENT_AMBULATORY_CARE_PROVIDER_SITE_OTHER): Payer: Medicaid Other | Admitting: Obstetrics and Gynecology

## 2021-05-24 ENCOUNTER — Encounter: Payer: Self-pay | Admitting: Obstetrics and Gynecology

## 2021-05-24 ENCOUNTER — Other Ambulatory Visit: Payer: Self-pay

## 2021-05-24 VITALS — BP 116/76 | HR 96 | Ht 62.5 in | Wt 153.0 lb

## 2021-05-24 DIAGNOSIS — Z3045 Encounter for surveillance of transdermal patch hormonal contraceptive device: Secondary | ICD-10-CM | POA: Diagnosis not present

## 2021-05-24 MED ORDER — XULANE 150-35 MCG/24HR TD PTWK: 1.0000 | MEDICATED_PATCH | TRANSDERMAL | 4 refills | Status: DC

## 2021-05-24 NOTE — Progress Notes (Signed)
° ° °  GYNECOLOGY CLINIC PROGRESS NOTE  Subjective:    Yolanda Pierce is a 31 y.o. G12P2002 female who presents for contraception management. Currently on Xulane patches.  Last seen by Dani Gobble, CNM who is no longer with current practice. Establishing care with new provider. The patient has no complaints today. The patient is sexually active. Pertinent past medical history: none.  Menstrual History: OB History  Gravida Para Term Preterm AB Living  2 2 2  0 0 2  SAB IAB Ectopic Multiple Live Births  0 0 0 0 2    # Outcome Date GA Lbr Len/2nd Weight Sex Delivery Anes PTL Lv  2 Term 07/16/16 [redacted]w[redacted]d  9 lb 13 oz (4.451 kg) M Vag-Spont EPI N LIV  1 Term 11/24/13 [redacted]w[redacted]d  8 lb 3.9 oz (3.74 kg) F CS-LTranv EPI  LIV      Menarche age: 47 Patient's last menstrual period was 04/30/2021 (approximate).    The following portions of the patient's history were reviewed and updated as appropriate: allergies, current medications, past family history, past medical history, past social history, past surgical history, and problem list.  Review of Systems Pertinent items noted in HPI and remainder of comprehensive ROS otherwise negative.   Objective:    General appearance: alert and no distress  Remainder of exam deferred.   Assessment:    31 y.o., continuing  Xulane patches , no contraindications.   Plan:   - Contraception: Xulane patches.  - Is due for wellness exam. Will schedule for when pap smear is due in August.    Rubie Maid, MD [facility]
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
## 2022-01-08 ENCOUNTER — Encounter: Payer: Medicaid Other | Admitting: Obstetrics and Gynecology

## 2022-01-08 DIAGNOSIS — Z01419 Encounter for gynecological examination (general) (routine) without abnormal findings: Secondary | ICD-10-CM

## 2022-01-08 DIAGNOSIS — Z124 Encounter for screening for malignant neoplasm of cervix: Secondary | ICD-10-CM

## 2023-01-29 ENCOUNTER — Emergency Department
Admission: EM | Admit: 2023-01-29 | Discharge: 2023-01-30 | Disposition: A | Discharge status: Home / Self Care | Payer: Medicaid Other | Attending: Emergency Medicine | Admitting: Emergency Medicine

## 2023-01-29 ENCOUNTER — Telehealth: Payer: Self-pay

## 2023-01-29 DIAGNOSIS — R1031 Right lower quadrant pain: Secondary | ICD-10-CM | POA: Insufficient documentation

## 2023-01-29 LAB — URINALYSIS, ROUTINE W REFLEX MICROSCOPIC
Bilirubin Urine: NEGATIVE
Glucose, UA: NEGATIVE mg/dL
Hgb urine dipstick: NEGATIVE
Ketones, ur: NEGATIVE mg/dL
Leukocytes,Ua: NEGATIVE
Nitrite: NEGATIVE
Protein, ur: NEGATIVE mg/dL
Specific Gravity, Urine: 1.021 (ref 1.005–1.030)
pH: 7 (ref 5.0–8.0)

## 2023-01-29 LAB — CBC
HCT: 39.4 % (ref 36.0–46.0)
Hemoglobin: 12.7 g/dL (ref 12.0–15.0)
MCH: 29.7 pg (ref 26.0–34.0)
MCHC: 32.2 g/dL (ref 30.0–36.0)
MCV: 92.3 fL (ref 80.0–100.0)
Platelets: 198 10*3/uL (ref 150–400)
RBC: 4.27 MIL/uL (ref 3.87–5.11)
RDW: 11.9 % (ref 11.5–15.5)
WBC: 5.2 10*3/uL (ref 4.0–10.5)
nRBC: 0 % (ref 0.0–0.2)

## 2023-01-29 LAB — COMPREHENSIVE METABOLIC PANEL WITH GFR
ALT: 13 U/L (ref 0–44)
AST: 16 U/L (ref 15–41)
Albumin: 4.3 g/dL (ref 3.5–5.0)
Alkaline Phosphatase: 52 U/L (ref 38–126)
Anion gap: 8 (ref 5–15)
BUN: 8 mg/dL (ref 6–20)
CO2: 27 mmol/L (ref 22–32)
Calcium: 9.3 mg/dL (ref 8.9–10.3)
Chloride: 104 mmol/L (ref 98–111)
Creatinine, Ser: 0.8 mg/dL (ref 0.44–1.00)
GFR, Estimated: >60 mL/min (ref >60)
Glucose, Bld: 97 mg/dL (ref 70–99)
Potassium: 4.5 mmol/L (ref 3.5–5.1)
Sodium: 139 mmol/L (ref 135–145)
Total Bilirubin: 0.9 mg/dL (ref 0.3–1.2)
Total Protein: 7.6 g/dL (ref 6.5–8.1)

## 2023-01-29 LAB — POC URINE PREG, ED: Preg Test, Ur: NEGATIVE

## 2023-01-29 LAB — LIPASE, BLOOD: Lipase: 26 U/L (ref 11–51)

## 2023-01-29 NOTE — ED Provider Notes (Signed)
[facility] Provider Note    Event Date/Time   First MD Initiated Contact with Patient 01/29/23 2132     (approximate)   History   Abdominal Pain   HPI  [facility] Rekowski is a 32 y.o. female   Past medical history of anemia, anxiety, depression who presents to the emergency department with right lower quadrant pain.  This has been ongoing, intermittent almost weekly since January.  She was seen by her gynecologist back then and advised on medications, monitoring.  However she continues to have intermittent pain, feels to be increasing in severity but same location quality and nonradiating.  No urinary symptoms, vaginal discharge, changes in menstruation patterns, nausea or vomiting, GI bleeding.  No fever or chills.   External Medical Documents Reviewed: Gynecology note from 05/24/2021 documenting gynecologic history      Physical Exam   Triage Vital Signs: ED Triage Vitals [01/29/23 1830]  Encounter Vitals Group     BP 113/81     Systolic BP Percentile      Diastolic BP Percentile      Pulse Rate 72     Resp 16     Temp 97.8 F (36.6 C)     Temp Source Oral     SpO2 100 %     Weight      Height      Head Circumference      Peak Flow      Pain Score 5     Pain Loc      Pain Education      Exclude from Growth Chart     Most recent vital signs: Vitals:   01/29/23 1830  BP: 113/81  Pulse: 72  Resp: 16  Temp: 97.8 F (36.6 C)  SpO2: 100%    General: Awake, no distress.  CV:  Good peripheral perfusion.  Resp:  Normal effort.  Abd:  No distention.  Other:  Soft nontender abdomen deep palpation all quadrants, no rigidity or guarding, nontoxic appearance without fever, normotension, no tachycardia.   ED Results / Procedures / Treatments   Labs (all labs ordered are listed, but only abnormal results are displayed) Labs Reviewed  URINALYSIS, ROUTINE W REFLEX MICROSCOPIC - Abnormal; Notable for the following components:       Result Value   Color, Urine YELLOW (*)    APPearance HAZY (*)    All other components within normal limits  LIPASE, BLOOD  COMPREHENSIVE METABOLIC PANEL  CBC  POC URINE PREG, ED     I ordered and reviewed the above labs they are notable for cell counts, electrolytes, and urinalysis largely unremarkable, pregnancy test negative   PROCEDURES:  Critical Care performed: No  Procedures   MEDICATIONS ORDERED IN ED: Medications - No data to display   IMPRESSION / MDM / ASSESSMENT AND PLAN / ED COURSE  I reviewed the triage vital signs and the nursing notes.                                Patient's presentation is most consistent with acute presentation with potential threat to life or bodily function.  Differential diagnosis includes, but is not limited to, musculoskeletal pain, muscle strain, appendicitis, ovarian torsion, urinary tract infection or pyelonephritis, kidney stone, menstrual cramping   The patient is on the cardiac monitor to evaluate for evidence of arrhythmia and/or significant heart rate changes.  MDM:  Broad differential diagnosis in this healthy young patient with ongoing intermittent abdominal pain on the right lower side since January.  Slowly increasing in severity but with a soft benign abdominal exam, negative pregnancy test, negative urinalysis, no leukocytosis, I doubt abdominal emergency or pelvic emergency at this time.  I talked to her about the utility and risk/benefit of advanced imaging like CT scan and together we decided to defer this imaging modality at this time.  Considered torsion but I think less likely given the chronic nature of the symptoms.  She will follow-up with gynecologist and surely return to the emergency department if there is any new or worsening.         FINAL CLINICAL IMPRESSION(S) / ED DIAGNOSES   Final diagnoses:  Right lower quadrant abdominal pain     Rx / DC Orders   ED Discharge Orders     None         Note:  This document was prepared using Dragon voice recognition software and may include unintentional dictation errors.    Pilar Jarvis, MD 01/29/23 2330

## 2023-01-29 NOTE — Telephone Encounter (Signed)
Pt called triage reporting right side pain, she states she was seen here 05/2021 for the same pain.I did not see where this pain was mentioned in her visit.  She was advised to call us if pain got worse. Pt advised to take ibuprofen to see if it helps, if pain persist she could let us know or go to ER if pain  gets to severe . Pt sates she will try the medication first and she has an appointment scheduled here next month.

## 2023-01-29 NOTE — ED Triage Notes (Signed)
Pt c/o RLQ abd pain since January with increasing pain. Pt denies N/V/D. LMP end of August.

## 2023-01-29 NOTE — Discharge Instructions (Signed)
Take acetaminophen 650 mg and ibuprofen 400 mg every 6 hours for pain.  Take with food.  At this point given the duration quality location of your symptoms along with your blood testing and examination I do not think that there is likely an emergency accounting for your pain like an infection, blockage, or twisting of your organs.   However if you experience any new, worsening, or expected symptoms call your doctor right away return to the emergency department for further evaluation.  Thank you for choosing [place] for your health care today!  Please see your primary doctor this week for a follow up appointment.   If you have any new, worsening, or unexpected symptoms call your doctor right away or come back to the emergency department for reevaluation.  It was my pleasure to care for you today.   Daneil Dan Modesto Charon, MD

## 2023-03-03 NOTE — Progress Notes (Unsigned)
GYNECOLOGY ANNUAL PHYSICAL EXAM PROGRESS NOTE  Subjective:    Yolanda Pierce is a 32 y.o. G12P2002 female who presents for an annual exam.  The patient {is/is not/has never been:13135} sexually active. The patient participates in regular exercise: {yes/no/not asked:9010}. Has the patient ever been transfused or tattooed?: {yes/no/not asked:9010}. The patient reports that there {is/is not:9024} domestic violence in her life.   The patient has the following complaints today:   Menstrual History: Menarche age: *** No LMP recorded.     Gynecologic History:  Contraception: {method:5051} History of STI's:  Last Pap: 01/01/2019. Results were: normal. Denies h/o abnormal pap smears. Last mammogram: Not age appropriate    OB History  Gravida Para Term Preterm AB Living  2 2 2  0 0 2  SAB IAB Ectopic Multiple Live Births  0 0 0 0 2    # Outcome Date GA Lbr Len/2nd Weight Sex Type Anes PTL Lv  2 Term 07/16/16 [redacted]w[redacted]d  9 lb 13 oz (4.451 kg) M Vag-Spont EPI N LIV  1 Term 11/24/13 [redacted]w[redacted]d  8 lb 3.9 oz (3.74 kg) F CS-LTranv EPI  LIV     Name: Cush,GIRL Elvera     Apgar1: 8  Apgar5: 9    Past Medical History:  Diagnosis Date   Anemia    On Iron tablets,once daily   Anxiety    Depression     Past Surgical History:  Procedure Laterality Date   CESAREAN SECTION N/A 11/24/2013   Procedure: CESAREAN SECTION;  Surgeon: Essie Hart, MD;  Location: [facility];  Service: Obstetrics;  Laterality: N/A;   cesarean section  2015   CESAREAN SECTION N/A 07/16/2016   Procedure: REPEAT CESAREAN SECTION;  Surgeon: Hildred Laser, MD;  Location: [facility];  Service: Obstetrics;  Laterality: N/A;    Family History  Problem Relation Age of Onset   Asthma Mother    Hypertension Mother    Diabetes Father    Hypertension Father    Sleep apnea Father    Cancer Neg Hx     Social History   Socioeconomic History   Marital status: Single    Spouse name: Not on file   Number of children: Not  on file   Years of education: Not on file   Highest education level: Not on file  Occupational History   Not on file  Tobacco Use   Smoking status: Former   Smokeless tobacco: Never  Vaping Use   Vaping status: Never Used  Substance and Sexual Activity   Alcohol use: No   Drug use: Yes    Types: Marijuana   Sexual activity: Yes    Birth control/protection: Coitus interruptus  Other Topics Concern   Not on file  Social History Narrative   Not on file   Social Determinants of Health   Financial Resource Strain: Not on file  Food Insecurity: Not on file  Transportation Needs: Not on file  Physical Activity: Not on file  Stress: Not on file  Social Connections: Not on file  Intimate Partner Violence: Not on file    Current Outpatient Medications on File Prior to Visit  Medication Sig Dispense Refill   Ferrous Sulfate (IRON PO) Take by mouth.     norelgestromin-ethinyl estradiol [facility]) 150-35 MCG/24HR transdermal patch Place 1 patch onto the skin once a week. 9 patch 4   No current facility-administered medications on file prior to visit.    Allergies  Allergen Reactions   Amoxicillin Rash, Anaphylaxis  and Hives    Redness and rash all over. Has patient had a PCN reaction causing immediate rash, facial/tongue/throat swelling, SOB or lightheadedness with hypotension: Yes Has patient had a PCN reaction causing severe rash involving mucus membranes or skin necrosis: No Has patient had a PCN reaction that required hospitalization Yes Has patient had a PCN reaction occurring within the last 10 years: No If all of the above answers are "NO", then may proceed with Cephalosporin use.     Review of Systems Constitutional: negative for chills, fatigue, fevers and sweats Eyes: negative for irritation, redness and visual disturbance Ears, nose, mouth, throat, and face: negative for hearing loss, nasal congestion, snoring and tinnitus Respiratory: negative for asthma, cough,  sputum Cardiovascular: negative for chest pain, dyspnea, exertional chest pressure/discomfort, irregular heart beat, palpitations and syncope Gastrointestinal: negative for abdominal pain, change in bowel habits, nausea and vomiting Genitourinary: negative for abnormal menstrual periods, genital lesions, sexual problems and vaginal discharge, dysuria and urinary incontinence Integument/breast: negative for breast lump, breast tenderness and nipple discharge Hematologic/lymphatic: negative for bleeding and easy bruising Musculoskeletal:negative for back pain and muscle weakness Neurological: negative for dizziness, headaches, vertigo and weakness Endocrine: negative for diabetic symptoms including polydipsia, polyuria and skin dryness Allergic/Immunologic: negative for hay fever and urticaria      Objective:  There were no vitals taken for this visit. There is no height or weight on file to calculate BMI.    General Appearance:    Alert, cooperative, no distress, appears stated age  Head:    Normocephalic, without obvious abnormality, atraumatic  Eyes:    PERRL, conjunctiva/corneas clear, EOM's intact, both eyes  Ears:    Normal external ear canals, both ears  Nose:   Nares normal, septum midline, mucosa normal, no drainage or sinus tenderness  Throat:   Lips, mucosa, and tongue normal; teeth and gums normal  Neck:   Supple, symmetrical, trachea midline, no adenopathy; thyroid: no enlargement/tenderness/nodules; no carotid bruit or JVD  Back:     Symmetric, no curvature, ROM normal, no CVA tenderness  Lungs:     Clear to auscultation bilaterally, respirations unlabored  Chest Wall:    No tenderness or deformity   Heart:    Regular rate and rhythm, S1 and S2 normal, no murmur, rub or gallop  Breast Exam:    No tenderness, masses, or nipple abnormality  Abdomen:     Soft, non-tender, bowel sounds active all four quadrants, no masses, no organomegaly.    Genitalia:    Pelvic:external  genitalia normal, vagina without lesions, discharge, or tenderness, rectovaginal septum  normal. Cervix normal in appearance, no cervical motion tenderness, no adnexal masses or tenderness.  Uterus normal size, shape, mobile, regular contours, nontender.  Rectal:    Normal external sphincter.  No hemorrhoids appreciated. Internal exam not done.   Extremities:   Extremities normal, atraumatic, no cyanosis or edema  Pulses:   2+ and symmetric all extremities  Skin:   Skin color, texture, turgor normal, no rashes or lesions  Lymph nodes:   Cervical, supraclavicular, and axillary nodes normal  Neurologic:   CNII-XII intact, normal strength, sensation and reflexes throughout   .  Labs:  Lab Results  Component Value Date   WBC 5.2 01/29/2023   HGB 12.7 01/29/2023   HCT 39.4 01/29/2023   MCV 92.3 01/29/2023   PLT 198 01/29/2023    Lab Results  Component Value Date   CREATININE 0.80 01/29/2023   BUN 8 01/29/2023   NA 139  01/29/2023   K 4.5 01/29/2023   CL 104 01/29/2023   CO2 27 01/29/2023    Lab Results  Component Value Date   ALT 13 01/29/2023   AST 16 01/29/2023   ALKPHOS 52 01/29/2023   BILITOT 0.9 01/29/2023    No results found for: "TSH"   Assessment:   No diagnosis found.   Plan:  Blood tests: Pending. Breast self exam technique reviewed and patient encouraged to perform self-exam monthly. Contraception:  Xulane patches . Discussed healthy lifestyle modifications. Mammogram  : Not age appropriate Pap smear ordered. Flu vaccine: Follow up in 1 year for annual exam   Hildred Laser, MD [facility]

## 2023-03-03 NOTE — Patient Instructions (Signed)
Preventive Care 21-30 Years Old, Female Preventive care refers to lifestyle choices and visits with your health care provider that can promote health and wellness. Preventive care visits are also called wellness exams. What can I expect for my preventive care visit? Counseling During your preventive care visit, your health care provider may ask about your: Medical history, including: Past medical problems. Family medical history. Pregnancy history. Current health, including: Menstrual cycle. Method of birth control. Emotional well-being. Home life and relationship well-being. Sexual activity and sexual health. Lifestyle, including: Alcohol, nicotine or tobacco, and drug use. Access to firearms. Diet, exercise, and sleep habits. Work and work environment. Sunscreen use. Safety issues such as seatbelt and bike helmet use. Physical exam Your health care provider may check your: Height and weight. These may be used to calculate your BMI (body mass index). BMI is a measurement that tells if you are at a healthy weight. Waist circumference. This measures the distance around your waistline. This measurement also tells if you are at a healthy weight and may help predict your risk of certain diseases, such as type 2 diabetes and high blood pressure. Heart rate and blood pressure. Body temperature. Skin for abnormal spots. What immunizations do I need?  Vaccines are usually given at various ages, according to a schedule. Your health care provider will recommend vaccines for you based on your age, medical history, and lifestyle or other factors, such as travel or where you work. What tests do I need? Screening Your health care provider may recommend screening tests for certain conditions. This may include: Pelvic exam and Pap test. Lipid and cholesterol levels. Diabetes screening. This is done by checking your blood sugar (glucose) after you have not eaten for a while (fasting). Hepatitis  B test. Hepatitis C test. HIV (human immunodeficiency virus) test. STI (sexually transmitted infection) testing, if you are at risk. BRCA-related cancer screening. This may be done if you have a family history of breast, ovarian, tubal, or peritoneal cancers. Talk with your health care provider about your test results, treatment options, and if necessary, the need for more tests. Follow these instructions at home: Eating and drinking  Eat a healthy diet that includes fresh fruits and vegetables, whole grains, lean protein, and low-fat dairy products. Take vitamin and mineral supplements as recommended by your health care provider. Do not drink alcohol if: Your health care provider tells you not to drink. You are pregnant, may be pregnant, or are planning to become pregnant. If you drink alcohol: Limit how much you have to 0-1 drink a day. Know how much alcohol is in your drink. In the [place] one drink equals one 12 oz bottle of beer (355 mL), one 5 oz glass of wine (148 mL), or one 1 oz glass of hard liquor (44 mL). Lifestyle Brush your teeth every morning and night with fluoride toothpaste. Floss one time each day. Exercise for at least 30 minutes 5 or more days each week. Do not use any products that contain nicotine or tobacco. These products include cigarettes, chewing tobacco, and vaping devices, such as e-cigarettes. If you need help quitting, ask your health care provider. Do not use drugs. If you are sexually active, practice safe sex. Use a condom or other form of protection to prevent STIs. If you do not wish to become pregnant, use a form of birth control. If you plan to become pregnant, see your health care provider for a prepregnancy visit. Find healthy ways to manage stress, such as: Meditation,   yoga, or listening to music. Journaling. Talking to a trusted person. Spending time with friends and family. Minimize exposure to UV radiation to reduce your risk of skin  cancer. Safety Always wear your seat belt while driving or riding in a vehicle. Do not drive: If you have been drinking alcohol. Do not ride with someone who has been drinking. If you have been using any mind-altering substances or drugs. While texting. When you are tired or distracted. Wear a helmet and other protective equipment during sports activities. If you have firearms in your house, make sure you follow all gun safety procedures. Seek help if you have been physically or sexually abused. What's next? Go to your health care provider once a year for an annual wellness visit. Ask your health care provider how often you should have your eyes and teeth checked. Stay up to date on all vaccines. This information is not intended to replace advice given to you by your health care provider. Make sure you discuss any questions you have with your health care provider. Document Revised: 10/25/2020 Document Reviewed: 10/25/2020 Elsevier Patient Education  2024 Elsevier Inc. Breast Self-Awareness Breast self-awareness is knowing how your breasts look and feel. You need to: Check your breasts on a regular basis. Tell your doctor about any changes. Become familiar with the look and feel of your breasts. This can help you catch a breast problem while it is still small and can be treated. You should do breast self-exams even if you have breast implants. What you need: A mirror. A well-lit room. A pillow or other soft object. How to do a breast self-exam Follow these steps to do a breast self-exam: Look for changes  Take off all the clothes above your waist. Stand in front of a mirror in a room with good lighting. Put your hands down at your sides. Compare your breasts in the mirror. Look for any difference between them, such as: A difference in shape. A difference in size. Wrinkles, dips, and bumps in one breast and not the other. Look at each breast for changes in the skin, such  as: Redness. Scaly areas. Skin that has gotten thicker. Dimpling. Open sores (ulcers). Look for changes in your nipples, such as: Fluid coming out of a nipple. Fluid around a nipple. Bleeding. Dimpling. Redness. A nipple that looks pushed in (retracted), or that has changed position. Feel for changes Lie on your back. Feel each breast. To do this: Pick a breast to feel. Place a pillow under the shoulder closest to that breast. Put the arm closest to that breast behind your head. Feel the nipple area of that breast using the hand of your other arm. Feel the area with the pads of your three middle fingers by making small circles with your fingers. Use light, medium, and firm pressure. Continue the overlapping circles, moving downward over the breast. Keep making circles with your fingers. Stop when you feel your ribs. Start making circles with your fingers again, this time going upward until you reach your collarbone. Then, make circles outward across your breast and into your armpit area. Squeeze your nipple. Check for discharge and lumps. Repeat these steps to check your other breast. Sit or stand in the tub or shower. With soapy water on your skin, feel each breast the same way you did when you were lying down. Write down what you find Writing down what you find can help you remember what to tell your doctor. Write down: What is   normal for each breast. Any changes you find in each breast. These include: The kind of changes you find. A tender or painful breast. Any lump you find. Write down its size and where it is. When you last had your monthly period (menstrual cycle). General tips If you are breastfeeding, the best time to check your breasts is after you feed your baby or after you use a breast pump. If you get monthly bleeding, the best time to check your breasts is 5-7 days after your monthly cycle ends. With time, you will become comfortable with the self-exam. You will  also start to know if there are changes in your breasts. Contact a doctor if: You see a change in the shape or size of your breasts or nipples. You see a change in the skin of your breast or nipples, such as red or scaly skin. You have fluid coming from your nipples that is not normal. You find a new lump or thick area. You have breast pain. You have any concerns about your breast health. Summary Breast self-awareness includes looking for changes in your breasts and feeling for changes within your breasts. You should do breast self-awareness in front of a mirror in a well-lit room. If you get monthly periods (menstrual cycles), the best time to check your breasts is 5-7 days after your period ends. Tell your doctor about any changes you see in your breasts. Changes include changes in size, changes on the skin, painful or tender breasts, or fluid from your nipples that is not normal. This information is not intended to replace advice given to you by your health care provider. Make sure you discuss any questions you have with your health care provider. Document Revised: 10/04/2021 Document Reviewed: 03/01/2021 Elsevier Patient Education  2024 Elsevier Inc.  

## 2023-03-04 ENCOUNTER — Other Ambulatory Visit (HOSPITAL_COMMUNITY)
Admission: RE | Admit: 2023-03-04 | Discharge: 2023-03-04 | Disposition: A | Discharge status: Home / Self Care | Payer: Medicaid Other | Source: Ambulatory Visit | Attending: Obstetrics and Gynecology | Admitting: Obstetrics and Gynecology

## 2023-03-04 ENCOUNTER — Ambulatory Visit: Payer: Medicaid Other | Admitting: Obstetrics and Gynecology

## 2023-03-04 ENCOUNTER — Encounter: Payer: Self-pay | Admitting: Obstetrics and Gynecology

## 2023-03-04 VITALS — BP 113/82 | HR 66 | Resp 16 | Ht 61.0 in | Wt 165.2 lb

## 2023-03-04 DIAGNOSIS — Z01419 Encounter for gynecological examination (general) (routine) without abnormal findings: Secondary | ICD-10-CM

## 2023-03-04 DIAGNOSIS — Z124 Encounter for screening for malignant neoplasm of cervix: Secondary | ICD-10-CM

## 2023-03-04 DIAGNOSIS — R102 Pelvic and perineal pain: Secondary | ICD-10-CM

## 2023-03-04 DIAGNOSIS — R638 Other symptoms and signs concerning food and fluid intake: Secondary | ICD-10-CM

## 2023-03-04 DIAGNOSIS — Z131 Encounter for screening for diabetes mellitus: Secondary | ICD-10-CM

## 2023-03-04 DIAGNOSIS — Z13220 Encounter for screening for lipoid disorders: Secondary | ICD-10-CM

## 2023-03-04 MED ORDER — NORELGESTROMIN-ETH ESTRADIOL 150-35 MCG/24HR TD PTWK: 1.0000 | MEDICATED_PATCH | TRANSDERMAL | 4 refills | Status: DC

## 2023-03-05 LAB — TSH: TSH: 1.87 u[IU]/mL (ref 0.450–4.500)

## 2023-03-05 LAB — LIPID PANEL
Chol/HDL Ratio: 2.1 ratio (ref 0.0–4.4)
Cholesterol, Total: 131 mg/dL (ref 100–199)
HDL: 61 mg/dL (ref >39)
LDL Chol Calc (NIH): 60 mg/dL (ref 0–99)
Triglycerides: 40 mg/dL (ref 0–149)
VLDL Cholesterol Cal: 10 mg/dL (ref 5–40)

## 2023-03-10 LAB — CYTOLOGY - PAP
Comment: NEGATIVE
Diagnosis: NEGATIVE
High risk HPV: NEGATIVE

## 2023-07-22 ENCOUNTER — Emergency Department
Admission: EM | Admit: 2023-07-22 | Discharge: 2023-07-22 | Disposition: A | Discharge status: Home / Self Care | Attending: Emergency Medicine | Admitting: Emergency Medicine

## 2023-07-22 ENCOUNTER — Other Ambulatory Visit: Payer: Self-pay

## 2023-07-22 DIAGNOSIS — A084 Viral intestinal infection, unspecified: Secondary | ICD-10-CM | POA: Insufficient documentation

## 2023-07-22 DIAGNOSIS — R112 Nausea with vomiting, unspecified: Secondary | ICD-10-CM

## 2023-07-22 LAB — CBC
HCT: 39.5 % (ref 36.0–46.0)
Hemoglobin: 13.3 g/dL (ref 12.0–15.0)
MCH: 30.7 pg (ref 26.0–34.0)
MCHC: 33.7 g/dL (ref 30.0–36.0)
MCV: 91.2 fL (ref 80.0–100.0)
Platelets: 180 10*3/uL (ref 150–400)
RBC: 4.33 MIL/uL (ref 3.87–5.11)
RDW: 12.4 % (ref 11.5–15.5)
WBC: 10.3 10*3/uL (ref 4.0–10.5)
nRBC: 0 % (ref 0.0–0.2)

## 2023-07-22 LAB — COMPREHENSIVE METABOLIC PANEL
ALT: 17 U/L (ref 0–44)
AST: 20 U/L (ref 15–41)
Albumin: 4.3 g/dL (ref 3.5–5.0)
Alkaline Phosphatase: 37 U/L — ABNORMAL LOW (ref 38–126)
Anion gap: 11 (ref 5–15)
BUN: 11 mg/dL (ref 6–20)
CO2: 21 mmol/L — ABNORMAL LOW (ref 22–32)
Calcium: 9.5 mg/dL (ref 8.9–10.3)
Chloride: 104 mmol/L (ref 98–111)
Creatinine, Ser: 0.79 mg/dL (ref 0.44–1.00)
GFR, Estimated: >60 mL/min (ref >60)
Glucose, Bld: 114 mg/dL — ABNORMAL HIGH (ref 70–99)
Potassium: 4.3 mmol/L (ref 3.5–5.1)
Sodium: 136 mmol/L (ref 135–145)
Total Bilirubin: 1.3 mg/dL — ABNORMAL HIGH (ref 0.0–1.2)
Total Protein: 7.4 g/dL (ref 6.5–8.1)

## 2023-07-22 LAB — URINALYSIS, ROUTINE W REFLEX MICROSCOPIC
Bilirubin Urine: NEGATIVE
Glucose, UA: NEGATIVE mg/dL
Hgb urine dipstick: NEGATIVE
Ketones, ur: 20 mg/dL — AB
Leukocytes,Ua: NEGATIVE
Nitrite: NEGATIVE
Protein, ur: NEGATIVE mg/dL
Specific Gravity, Urine: 1.024 (ref 1.005–1.030)
pH: 6 (ref 5.0–8.0)

## 2023-07-22 LAB — PREGNANCY, URINE: Preg Test, Ur: NEGATIVE

## 2023-07-22 LAB — LIPASE, BLOOD: Lipase: 25 U/L (ref 11–51)

## 2023-07-22 MED ORDER — ONDANSETRON 4 MG PO TBDP: 4.0000 mg | ORAL_TABLET | Freq: Three times a day (TID) | ORAL | 0 refills | Status: AC | PRN

## 2023-07-22 MED ORDER — FAMOTIDINE IN NACL 20-0.9 MG/50ML-% IV SOLN
20.0000 mg | Freq: Once | INTRAVENOUS | Status: AC
  Administered 2023-07-22: 20 mg via INTRAVENOUS
  Filled 2023-07-22: qty 50

## 2023-07-22 MED ORDER — ONDANSETRON HCL 4 MG/2ML IJ SOLN
4.0000 mg | Freq: Once | INTRAMUSCULAR | Status: AC
  Administered 2023-07-22: 4 mg via INTRAVENOUS
  Filled 2023-07-22: qty 2

## 2023-07-22 MED ORDER — SODIUM CHLORIDE 0.9 % IV BOLUS
1000.0000 mL | Freq: Once | INTRAVENOUS | Status: AC
  Administered 2023-07-22: 1000 mL via INTRAVENOUS

## 2023-07-22 MED ORDER — DICYCLOMINE HCL 10 MG PO CAPS
10.0000 mg | ORAL_CAPSULE | Freq: Once | ORAL | Status: AC
  Administered 2023-07-22: 10 mg via ORAL
  Filled 2023-07-22: qty 1

## 2023-07-22 MED ORDER — DICYCLOMINE HCL 10 MG PO CAPS: 10.0000 mg | ORAL_CAPSULE | Freq: Three times a day (TID) | ORAL | 0 refills | Status: AC | PRN

## 2023-07-22 MED ORDER — FAMOTIDINE 20 MG PO TABS: 20.0000 mg | ORAL_TABLET | Freq: Two times a day (BID) | ORAL | 0 refills | Status: AC

## 2023-07-22 NOTE — ED Provider Notes (Signed)
 [facility] Provider Note    Event Date/Time   First MD Initiated Contact with Patient 07/22/23 1949     (approximate)   History   Emesis   HPI  Yolanda Pierce is a 33 y.o. female with history of anemia, anxiety, and depression who presents with nausea, vomiting, diarrhea, acute onset around 5 AM today, persistent course since then, with both the vomitus and the diarrhea being nonbloody.  The patient reports crampy diffuse abdominal pain.  She has had some chills but no noted fever.  She reports that her kids were sick with similar symptoms although recovered much more quickly.  I reviewed the past medical records.  The patient's most recent outpatient counter was on 10/22 of last year with Dr. Valentino Saxon from OB/GYN for an annual exam.    Physical Exam   Triage Vital Signs: ED Triage Vitals  Encounter Vitals Group     BP 07/22/23 1828 96/61     Systolic BP Percentile --      Diastolic BP Percentile --      Pulse Rate 07/22/23 1828 78     Resp 07/22/23 1826 18     Temp 07/22/23 1826 97.8 F (36.6 C)     Temp Source 07/22/23 1826 Oral     SpO2 07/22/23 1828 100 %     Weight 07/22/23 1825 160 lb (72.6 kg)     Height 07/22/23 1825 5\' 1"  (1.549 m)     Head Circumference --      Peak Flow --      Pain Score 07/22/23 1825 10     Pain Loc --      Pain Education --      Exclude from Growth Chart --     Most recent vital signs: Vitals:   07/22/23 1828 07/22/23 1946  BP: 96/61 105/62  Pulse: 78 73  Resp: 20 18  Temp:  98.1 F (36.7 C)  SpO2: 100% 100%    General: Alert, uncomfortable appearing, no distress.  CV:  Good peripheral perfusion.  Resp:  Normal effort.  Abd:  Mild diffuse discomfort to palpation, no focal tenderness.  No distention.  Other:  No jaundice or scleral icterus.  Dry mucous membranes.   ED Results / Procedures / Treatments   Labs (all labs ordered are listed, but only abnormal results are displayed) Labs  Reviewed  COMPREHENSIVE METABOLIC PANEL - Abnormal; Notable for the following components:      Result Value   CO2 21 (*)    Glucose, Bld 114 (*)    Alkaline Phosphatase 37 (*)    Total Bilirubin 1.3 (*)    All other components within normal limits  URINALYSIS, ROUTINE W REFLEX MICROSCOPIC - Abnormal; Notable for the following components:   Color, Urine YELLOW (*)    APPearance HAZY (*)    Ketones, ur 20 (*)    All other components within normal limits  LIPASE, BLOOD  CBC  PREGNANCY, URINE     EKG   RADIOLOGY   PROCEDURES:  Critical Care performed: No  Procedures   MEDICATIONS ORDERED IN ED: Medications  sodium chloride 0.9 % bolus 1,000 mL (0 mLs Intravenous Stopped 07/22/23 2150)  ondansetron (ZOFRAN) injection 4 mg (4 mg Intravenous Given 07/22/23 2051)  dicyclomine (BENTYL) capsule 10 mg (10 mg Oral Given 07/22/23 2051)  famotidine (PEPCID) IVPB 20 mg premix (0 mg Intravenous Stopped 07/22/23 2150)     IMPRESSION / MDM / ASSESSMENT AND PLAN /  ED COURSE  I reviewed the triage vital signs and the nursing notes.  33 year old female with PMH as noted above presents with nausea, vomiting, diarrhea as well as diffuse crampy abdominal pain since 5 AM today.  On exam the vital signs are normal.  Mucous membranes are somewhat dry.  The patient has no focal abdominal tenderness or peritoneal signs.  Differential diagnosis includes, but is not limited to, norovirus or other viral gastroenteritis, foodborne illness, gastroparesis, gastritis.  CMP shows low bicarb but no other acute findings.  Lipase is normal.  CBC shows no leukocytosis.  Urinalysis is negative except for small ketones.  We will give fluids, symptomatic treatment, and reassess.  Patient's presentation is most consistent with acute complicated illness / injury requiring diagnostic workup.  ----------------------------------------- 10:23 PM on 07/22/2023 -----------------------------------------  The patient is  feeling significantly better and is tolerating p.o.  I counseled her on the results of the workup.  She is stable for discharge at this time.  I will prescribe the same occasion she received in the ED: Zofran, Bentyl, and Pepcid.  I gave strict return precautions and she expressed understanding.   FINAL CLINICAL IMPRESSION(S) / ED DIAGNOSES   Final diagnoses:  Viral gastroenteritis  Nausea vomiting and diarrhea     Rx / DC Orders   ED Discharge Orders          Ordered    dicyclomine (BENTYL) 10 MG capsule  Every 8 hours PRN        07/22/23 2223    famotidine (PEPCID) 20 MG tablet  2 times daily        07/22/23 2223    ondansetron (ZOFRAN-ODT) 4 MG disintegrating tablet  Every 8 hours PRN        07/22/23 2223             Note:  This document was prepared using Dragon voice recognition software and may include unintentional dictation errors.    Dionne Bucy, MD 07/22/23 2224

## 2023-07-22 NOTE — ED Triage Notes (Signed)
 Mother states N/V/D since 0500; reports kids had similar symptoms.

## 2024-06-18 ENCOUNTER — Other Ambulatory Visit: Payer: Self-pay

## 2024-06-18 DIAGNOSIS — Z01419 Encounter for gynecological examination (general) (routine) without abnormal findings: Secondary | ICD-10-CM

## 2024-06-18 MED ORDER — NORELGESTROMIN-ETH ESTRADIOL 150-35 MCG/24HR TD PTWK: 1.0000 | MEDICATED_PATCH | TRANSDERMAL | 0 refills | Status: AC

## 2024-07-28 ENCOUNTER — Ambulatory Visit: Admitting: Certified Nurse Midwife
